# Patient Record
Sex: Female | Born: 1945 | Race: White | Hispanic: No | Marital: Married | Smoking: Former smoker
Health system: Southern US, Community
[De-identification: ages and names within clinical notes are randomized; demographics above are authoritative.]

## PROBLEM LIST (undated history)

## (undated) DIAGNOSIS — M21619 Bunion of unspecified foot: Secondary | ICD-10-CM

## (undated) DIAGNOSIS — Z8489 Family history of other specified conditions: Secondary | ICD-10-CM

## (undated) DIAGNOSIS — G500 Trigeminal neuralgia: Secondary | ICD-10-CM

## (undated) DIAGNOSIS — T8859XA Other complications of anesthesia, initial encounter: Secondary | ICD-10-CM

## (undated) DIAGNOSIS — J189 Pneumonia, unspecified organism: Secondary | ICD-10-CM

## (undated) HISTORY — PX: BUNIONECTOMY: SHX129

## (undated) HISTORY — PX: BRAIN SURGERY: SHX531

## (undated) HISTORY — PX: CEREBRAL MICROVASCULAR DECOMPRESSION: SHX1328

## (undated) HISTORY — PX: OTHER SURGICAL HISTORY: SHX169

## (undated) HISTORY — DX: Trigeminal neuralgia: G50.0

---

## 2019-07-17 ENCOUNTER — Ambulatory Visit: Payer: Medicare Other | Attending: Internal Medicine

## 2019-07-17 DIAGNOSIS — Z23 Encounter for immunization: Secondary | ICD-10-CM | POA: Insufficient documentation

## 2019-07-17 NOTE — Progress Notes (Signed)
   Covid-19 Vaccination Clinic  Name:  Ellen Griffith    MRN: [id number] DOB: [birth year]  07/17/2019  Ellen Griffith was observed post Covid-19 immunization for 15 minutes without incident. She was provided with Vaccine Information Sheet and instruction to access the V-Safe system.   Ellen Griffith was instructed to call 911 with any severe reactions post vaccine: Marland Kitchen Difficulty breathing  . Swelling of face and throat  . A fast heartbeat  . A bad rash all over body  . Dizziness and weakness   Immunizations Administered    Name Date Dose VIS Date Route   Pfizer COVID-19 Vaccine 07/17/2019 10:28 AM 0.3 mL 04/22/2019 Intramuscular   Manufacturer: [employer], [employer]   Lot: [id number]   NDC: [address]-3

## 2019-08-17 ENCOUNTER — Ambulatory Visit: Payer: Medicare Other | Attending: Internal Medicine

## 2019-08-17 DIAGNOSIS — Z23 Encounter for immunization: Secondary | ICD-10-CM

## 2019-08-17 NOTE — Progress Notes (Signed)
   Covid-19 Vaccination Clinic  Name:  Ellen Griffith    MRN: [id number] DOB: February 23, 1946  08/17/2019  [name] was observed post Covid-19 immunization for 15 minutes without incident. She was provided with Vaccine Information Sheet and instruction to access the V-Safe system.   [name] was instructed to call 911 with any severe reactions post vaccine: Marland Kitchen Difficulty breathing  . Swelling of face and throat  . A fast heartbeat  . A bad rash all over body  . Dizziness and weakness   Immunizations Administered    Name Date Dose VIS Date Route   Pfizer COVID-19 Vaccine 08/17/2019 11:48 AM 0.3 mL 04/22/2019 Intramuscular   Manufacturer: [employer], [employer]   Lot: [id number]   NDC: [address]-6

## 2020-03-24 ENCOUNTER — Ambulatory Visit: Payer: Medicare Other | Attending: Internal Medicine

## 2020-03-24 DIAGNOSIS — Z23 Encounter for immunization: Secondary | ICD-10-CM

## 2020-03-24 NOTE — Progress Notes (Signed)
   Covid-19 Vaccination Clinic  Name:  Ellen Griffith    MRN: [id number] DOB: 12-06-1945  03/24/2020  [name] was observed post Covid-19 immunization for 15 minutes without incident. She was provided with Vaccine Information Sheet and instruction to access the V-Safe system.   [name] was instructed to call 911 with any severe reactions post vaccine: Marland Kitchen Difficulty breathing  . Swelling of face and throat  . A fast heartbeat  . A bad rash all over body  . Dizziness and weakness   Immunizations Administered    Name Date Dose VIS Date Route   Pfizer COVID-19 Vaccine 03/24/2020  3:32 PM 0.3 mL 02/29/2020 Intramuscular   Manufacturer: [employer], [employer]   Lot: [id number]   NDC: [address]-5

## 2020-05-08 ENCOUNTER — Other Ambulatory Visit: Payer: Self-pay | Admitting: Registered Nurse

## 2020-05-08 DIAGNOSIS — E2839 Other primary ovarian failure: Secondary | ICD-10-CM

## 2020-05-08 DIAGNOSIS — Z1231 Encounter for screening mammogram for malignant neoplasm of breast: Secondary | ICD-10-CM

## 2020-05-21 ENCOUNTER — Other Ambulatory Visit: Payer: Self-pay

## 2020-05-21 ENCOUNTER — Encounter: Payer: Self-pay | Admitting: Plastic Surgery

## 2020-05-21 ENCOUNTER — Ambulatory Visit (INDEPENDENT_AMBULATORY_CARE_PROVIDER_SITE_OTHER): Payer: Medicare Other | Admitting: Plastic Surgery

## 2020-05-21 VITALS — BP 120/68 | HR 70 | Ht 65.0 in | Wt 140.4 lb

## 2020-05-21 DIAGNOSIS — T84498A Other mechanical complication of other internal orthopedic devices, implants and grafts, initial encounter: Secondary | ICD-10-CM

## 2020-05-21 NOTE — Progress Notes (Signed)
   Referring Provider Karl Ito, DO [address] [place],  [place] [address]   CC:  Chief Complaint  Patient presents with  . Advice Only      Ellen Griffith is an 75 y.o. female.  HPI: Patient presents with exposed hardware in the right posterior scalp.  She had a decompression for trigeminal neuralgia that required at titanium mesh plate be placed in the mastoid area.  Several months ago she noticed it looking different back there and she developed an exposure.  She has seen Dr. Johnsie Cancel with neurosurgery who is planning removal of the plate and he is asked for my assistance in closing the wound.  Patient reports a little bit of pain and itching back there but nothing bad.  No Known Allergies  Outpatient Encounter Medications as of 05/21/2020  Medication Sig  . LIVALO 4 MG TABS Take 1 tablet by mouth daily.  Marland Kitchen topiramate (TOPAMAX) 200 MG tablet Take 1 tablet by mouth 2 (two) times daily.  . traMADol (ULTRAM) 50 MG tablet Take 50 mg by mouth 2 (two) times daily as needed.  Marland Kitchen VASCEPA 1 g capsule Take 2 g by mouth 2 (two) times daily.  Marland Kitchen venlafaxine XR (EFFEXOR-XR) 75 MG 24 hr capsule Take 75 mg by mouth daily.  . Vitamin D, Ergocalciferol, (DRISDOL) 1.25 MG (50000 UNIT) CAPS capsule Take 50,000 Units by mouth once a week.   No facility-administered encounter medications on file as of 05/21/2020.     No past medical history on file.   No family history on file.  Social History   Social History Narrative  . Not on file  Denies tobacco use  Review of Systems General: Denies fevers, chills, weight loss CV: Denies chest pain, shortness of breath, palpitations  Physical Exam Vitals with BMI 05/21/2020  Height 5\' 5"   Weight 140 lbs 6 oz  BMI 23.36  Systolic 120  Diastolic 68  Pulse 70    General:  No acute distress,  Alert and oriented, Non-Toxic, Normal speech and affect Examination shows around a 4 cm x 3 cm area of exposed plate within the hairbearing scalp posterior to  the right ear.  There is a little bit of surrounding erythema but no purulence or drainage.  It looks like her scar is extending superiorly from that location.  I do not see any other obvious scars in the posterior aspect of her scalp.  Assessment/Plan Patient presents with exposed hardware in the hairbearing scalp.  We discussed the joint case with Dr. where he would remove the plate and do any bony debridement that was required followed by me doing a rotational flap to close it.  I took a picture and drew out the planned incision so that she would have an understanding of the plan.  We discussed the risks that include bleeding, infection, damage to surrounding structures and need for additional procedures.  All of her questions were answered and we will plan to coordinate this case as soon as possible for her.  Johnsie Cancel 05/21/2020, 2:08 PM

## 2020-05-31 ENCOUNTER — Other Ambulatory Visit: Payer: Self-pay | Admitting: Neurological Surgery

## 2020-06-12 ENCOUNTER — Other Ambulatory Visit: Payer: Self-pay

## 2020-06-12 ENCOUNTER — Encounter: Payer: Self-pay | Admitting: Neurology

## 2020-06-12 ENCOUNTER — Ambulatory Visit (INDEPENDENT_AMBULATORY_CARE_PROVIDER_SITE_OTHER): Payer: Medicare Other | Admitting: Neurology

## 2020-06-12 VITALS — BP 104/65 | HR 75 | Ht 65.5 in | Wt 146.0 lb

## 2020-06-12 DIAGNOSIS — G500 Trigeminal neuralgia: Secondary | ICD-10-CM

## 2020-06-12 NOTE — Patient Instructions (Signed)
Consider other medications for TGN: including Trileptal(Oxcarbazepine which is similar tegretol but less side effects), Lamictal(Lamotrigine), Baclofen. botox results have not been convincing. Also look into Gamma Knife for TGN   Trigeminal Neuralgia  Trigeminal neuralgia is a nerve disorder that causes severe pain on one side of the face. The pain may last from a few seconds to several minutes. The pain is usually only on one side of the face. Symptoms may occur for days, weeks, or months and then go away for months or years. The pain may return and be worse than before. What are the causes? This condition is caused by damage or pressure to a nerve in the head that is called the trigeminal nerve. An attack can be triggered by:  Talking.  Chewing.  Putting on makeup.  Washing your face.  Shaving your face.  Brushing your teeth.  Touching your face. What increases the risk? You are more likely to develop this condition if you:  Are 58 years of age or older.  Are female. What are the signs or symptoms? The main symptom of this condition is severe pain in the:  Jaw.  Lips.  Eyes.  Nose.  Scalp.  Forehead.  Face. The pain may be:  Intense.  Stabbing.  Electric.  Shock-like. How is this diagnosed? This condition is diagnosed with a physical exam. A CT scan or an MRI may be done to rule out other conditions that can cause facial pain. How is this treated? This condition may be treated with:  Avoiding the things that trigger your symptoms.  Taking prescription medicines (anticonvulsants).  Having surgery. This may be done in severe cases if other medical treatment does not provide relief.  Having procedures such as ablation, thermal, or radiation therapy. It may take up to one month for treatment to start relieving the pain. Follow these instructions at home: Managing pain  Learn as much as you can about how to manage your pain. Ask your health care  provider if a pain specialist would be helpful.  Consider talking with a mental health care provider (psychologist) about how to cope with the pain.  Consider joining a pain support group. General instructions  Take over-the-counter and prescription medicines only as told by your health care provider.  Avoid the things that trigger your symptoms. It may help to: ? Chew on the unaffected side of your mouth. ? Avoid touching your face. ? Avoid blasts of hot or cold air.  Follow your treatment plan as told by your health care provider. This may include: ? Cognitive or behavioral therapy. ? Gentle, regular exercise. ? Meditation or yoga. ? Aromatherapy.  Keep all follow-up visits as told by your health care provider. You may need to be monitored closely to make sure treatment is working well for you. Where to find more information  Facial Pain Association: fpa-support.org Contact a health care provider if:  Your medicine is not helping your symptoms.  You have side effects from the medicine used for treatment.  You develop new, unexplained symptoms, such as: ? Double vision. ? Facial weakness. ? Facial numbness. ? Changes in hearing or balance.  You feel depressed. Get help right away if:  Your pain is severe and is not getting better.  You develop suicidal thoughts. If you ever feel like you may hurt yourself or others, or have thoughts about taking your own life, get help right away. You can go to your nearest emergency department or call:  Your local emergency  services (911 in the [place]  A suicide crisis helpline, such as the National Suicide Prevention Lifeline at 406-852-4031. This is open 24 hours a day. Summary  Trigeminal neuralgia is a nerve disorder that causes severe pain on one side of the face. The pain may last from a few seconds to several minutes.  This condition is caused by damage or pressure to a nerve in the head that is called the trigeminal  nerve.  Treatment may include avoiding the things that trigger your symptoms, taking medicines, or having surgery or procedures. It may take up to one month for treatment to start relieving the pain.  Avoid the things that trigger your symptoms.  Keep all follow-up visits as told by your health care provider. You may need to be monitored closely to make sure treatment is working well for you. This information is not intended to replace advice given to you by your health care provider. Make sure you discuss any questions you have with your health care provider. Document Revised: 03/15/2018 Document Reviewed: 03/15/2018 Elsevier Patient Education  2021 [employer].

## 2020-06-12 NOTE — Progress Notes (Signed)
[facility]    Provider:  Dr Lucia Gaskins Requesting Provider: Jadene Pierini, MD Primary Care Provider:  Loura Back, NP  CC:  Trigeminal neuralgia  HPI:  Ellen Griffith is a 75 y.o. female here as requested by Jadene Pierini, MD for Ellen Griffith for facial pain/trigeminal neuralgia.   I reviewed Dr. Romana Juniper notes, she is a 75 year old female who has trigeminal neuralgia status post microvascular decompression at [facility] roughly 10 years prior, roughly 1 year ago she had wound breakdown and a plastic surgeon did some local wound procedure, no systemic symptoms, no discharge from the wound, no history of infections coming from the wound, Dr. Johnsie Cancel thought she had what sounded like type I trigeminal neuralgia prior to surgery, resolution of symptoms postop, the development of what sounds like type II trigeminal neuralgia, she is not a smoker, I reviewed Dr. Romana Juniper examination which was normal including palpation of the spine, range of motion, motor strength, sensory, absent Hoffmann's, absent clonus, diagnosed with degenerative status post microvascular decompression with delayed wound breakdown and exposed hardware, 1 treatment option was surgical revision of the wound, he thought it would be unwise and likely low yield to perform a repeat exploration and microvascular decompression at the same time as her wound revision, they would have to remove the plate and make sure she does not have any ongoing osteomyelitis and perform a complex repair with plastic surgery, she was referred to Dr. Karle Barr for evaluation regarding this, here for treatment of the facial pain.  She had microvascular decompression at [facility] and over the last few years the wound  Stitches have come out and she is going to have a revision there. She has TGN for years and it was so bad she went and had surgery in 2008 and it helped but was not complete relief. Sheis on Topamax. She has  never been off of that. She takes tramadol when it is bad. The pain varies down the jawline but can happen in any distribution, used to be shock like but since the operation different, heating pad helps, brushing or eating triggers, brief but can have some lasting lingering pain in between, random, wwaxes and wanes can happen every day for a week and then none for a week. Unknown inciting events. She get going to the dentist thought it was dental pain, No rashs. No other focal neurologic deficits, associated symptoms, inciting events or modifiable factors.  Reviewed notes, labs and imaging from outside physicians, which showed: see above  Review of Systems: Patient complains of symptoms per HPI as well as the following symptoms: facial pain. Pertinent negatives and positives per HPI. All others negative.   Social History   Socioeconomic History  . Marital status: Married    Spouse name: Not on file  . Number of children: 2  . Years of education: Not on file  . Highest education level: Master's degree (e.g., MA, MS, MEng, MEd, MSW, MBA)  Occupational History  . Not on file  Tobacco Use  . Smoking status: Former Smoker    Types: Cigarettes    Quit date: 05/21/2018    Years since quitting: 2.0  . Smokeless tobacco: Never Used  Vaping Use  . Vaping Use: Never used  Substance and Sexual Activity  . Alcohol use: Yes    Comment: "wine once in awhile"  . Drug use: Never  . Sexual activity: Not on file  Other Topics Concern  . Not on file  Social History Narrative  Pt and husband are currently living with daughter. Was previously in [place], currently looking for a place.   Right handed   Caffeine: maybe 3 cups/day   Social Determinants of Health   Financial Resource Strain: Not on file  Food Insecurity: Not on file  Transportation Needs: Not on file  Physical Activity: Not on file  Stress: Not on file  Social Connections: Not on file  Intimate Partner Violence: Not on file     Family History  Problem Relation Age of Onset  . Heart disease Mother        valve problems secondary to rheumatic heart disease  . Migraines Daughter     Past Medical History:  Diagnosis Date  . Trigeminal neuralgia     Patient Active Problem List   Diagnosis Date Noted  . Trigeminal neuralgia of right side of face 06/12/2020    Past Surgical History:  Procedure Laterality Date  . BUNIONECTOMY Bilateral   . CEREBRAL MICROVASCULAR DECOMPRESSION  2008-2009   trigeminal neuralgia; at [facility]  . wound procedure     by plastic surgery    Current Outpatient Medications  Medication Sig Dispense Refill  . LIVALO 4 MG TABS Take 1 tablet by mouth daily.    Marland Kitchen topiramate (TOPAMAX) 200 MG tablet Take 1 tablet by mouth 2 (two) times daily.    . traMADol (ULTRAM) 50 MG tablet Take 50 mg by mouth 2 (two) times daily as needed.    Marland Kitchen VASCEPA 1 g capsule Take 2 g by mouth 2 (two) times daily.    Marland Kitchen venlafaxine XR (EFFEXOR-XR) 75 MG 24 hr capsule Take 75 mg by mouth daily.    . Vitamin D, Ergocalciferol, (DRISDOL) 1.25 MG (50000 UNIT) CAPS capsule Take 50,000 Units by mouth once a week.     No current facility-administered medications for this visit.    Allergies as of 06/12/2020  . (No Known Allergies)    Vitals: BP 104/65 (BP Location: Right Arm, Patient Position: Sitting)   Pulse 75   Ht 5' 5.5" (1.664 m)   Wt 146 lb (66.2 kg)   BMI 23.93 kg/m  Last Weight:  Wt Readings from Last 1 Encounters:  06/12/20 146 lb (66.2 kg)   Last Height:   Ht Readings from Last 1 Encounters:  06/12/20 5' 5.5" (1.664 m)     Physical exam: Exam: Gen: NAD, conversant, well nourised, well groomed                     CV: RRR, no MRG. No Carotid Bruits. No peripheral edema, warm, nontender Eyes: Conjunctivae clear without exudates or hemorrhage  Neuro: Detailed Neurologic Exam  Speech:    Speech is normal; fluent and spontaneous with normal comprehension.  Cognition:    The  patient is oriented to person, place, and time;     recent and remote memory intact;     language fluent;     normal attention, concentration,     fund of knowledge Cranial Nerves:    The pupils are equal, round, and reactive to light. Pupils too small to visualize fundi. Visual fields are full to threat. Extraocular movements are intact. Trigeminal sensation is intact and the muscles of mastication are normal. The face is symmetric. The palate elevates in the midline. Hearing intact. Voice is normal. Shoulder shrug is normal. The tongue has normal motion without fasciculations.   Coordination:    No dysmetria or ataxia  Gait:    Normal native  gait  Motor Observation:    No asymmetry, no atrophy, and no involuntary movements noted. Tone:    Normal muscle tone.    Posture:    Posture is normal. normal erect    Strength:    Strength is V/V in the upper and lower limbs.      Sensation: intact to LT     Reflex Exam:  DTR's:    Deep tendon reflexes in the upper and lower extremities are normal bilaterally.   Toes:    The toes are downgoing bilaterally.   Clonus:    Clonus is absent.    Assessment/Plan:  [age] year old with TGN right side s/p microvascular decompression and on Topamax. We discussed options, she will return in 4-6 week.  Consider other medications for TGN: including Trileptal(Oxcarbazepine which is similar tegretol but less side effects), Lamictal(Lamotrigine), Baclofen. botox results have not been convincing. Also look into Gamma Knife for TGN, showed her some videos online she can watch   No orders of the defined types were placed in this encounter.  No orders of the defined types were placed in this encounter.   Cc: Jadene Pierini, MD,  Loura Back, NP, Dr. Maurice Small [facility])  Naomie Dean, MD  [facility] [address] [place], [place] [address]  Phone [phone] Fax [phone]  I spent over  60 minutes of face-to-face and non-face-to-face time with patient on the  1. Trigeminal neuralgia of right side of face    diagnosis.  This included previsit chart review, lab review, study review, order entry, electronic health record documentation, patient education on the different diagnostic and therapeutic options, counseling and coordination of care, risks and benefits of management, compliance, or risk factor reduction

## 2020-06-14 ENCOUNTER — Other Ambulatory Visit: Payer: Self-pay

## 2020-06-14 ENCOUNTER — Ambulatory Visit (INDEPENDENT_AMBULATORY_CARE_PROVIDER_SITE_OTHER): Payer: Medicare Other | Admitting: Surgical

## 2020-06-14 ENCOUNTER — Encounter: Payer: Self-pay | Admitting: Surgical

## 2020-06-14 VITALS — BP 122/72 | HR 78 | Ht 65.0 in | Wt 145.0 lb

## 2020-06-14 DIAGNOSIS — T84498A Other mechanical complication of other internal orthopedic devices, implants and grafts, initial encounter: Secondary | ICD-10-CM

## 2020-06-14 MED ORDER — ONDANSETRON HCL 4 MG PO TABS: 4.0000 mg | ORAL_TABLET | Freq: Three times a day (TID) | ORAL | 0 refills | Status: DC | PRN

## 2020-06-14 MED ORDER — HYDROCODONE-ACETAMINOPHEN 5-325 MG PO TABS: 1.0000 | ORAL_TABLET | Freq: Four times a day (QID) | ORAL | 0 refills | Status: AC | PRN

## 2020-06-14 NOTE — Progress Notes (Signed)
Patient ID: Ellen Griffith, female    DOB: January 25, 1946, 75 y.o.   MRN: [id number]  Chief Complaint  Patient presents with  . Pre-op Exam      ICD-10-CM   1. Exposed orthopaedic hardware [facility])  T84.498A     History of Present Illness: Ellen Griffith is a 75 y.o.  female  with a history of scalp hardware that has now become exposed in the right posterior scalp.  She presents for preoperative evaluation for upcoming procedure, complex scalp wound closure with adjacent tissue transfer, possible split-thickness skin graft  after retrosigmoid craniectomy for removal of plate by neurosurgery, scheduled for 06/26/2020 with Dr. Arita Miss and Dr. Maurice Small  The patient has not had problems with anesthesia. No history of DVT/PE.  No family history of DVT/PE.  No family or personal history of bleeding or clotting disorders.  Patient is not currently taking any blood thinners.  No history of CVA/MI.   Summary of Previous Visit: Patient had decompression for trigeminal neuralgia that required titanium mesh plate be placed in the mastoid area.  She noticed it looking different bacteria and she developed an exposure.  She has seen neurosurgery who is planning to remove the plate.  Neurosurgery has asked for assistance with closing the wound.  PMH Significant for: Hyperlipidemia, trigeminal neuralgia,   Past Medical History: Allergies: No Known Allergies  Current Medications:  Current Outpatient Medications:  .  HYDROcodone-acetaminophen (NORCO) 5-325 MG tablet, Take 1 tablet by mouth every 6 (six) hours as needed for up to 5 days for severe pain (Postop pain only)., Disp: 20 tablet, Rfl: 0 .  LIVALO 4 MG TABS, Take 1 tablet by mouth daily., Disp: , Rfl:  .  ondansetron (ZOFRAN) 4 MG tablet, Take 1 tablet (4 mg total) by mouth every 8 (eight) hours as needed for nausea or vomiting., Disp: 20 tablet, Rfl: 0 .  topiramate (TOPAMAX) 200 MG tablet, Take 1 tablet by mouth 2 (two) times daily., Disp: , Rfl:  .   traMADol (ULTRAM) 50 MG tablet, Take 50 mg by mouth 2 (two) times daily as needed., Disp: , Rfl:  .  VASCEPA 1 g capsule, Take 2 g by mouth 2 (two) times daily., Disp: , Rfl:  .  venlafaxine XR (EFFEXOR-XR) 75 MG 24 hr capsule, Take 75 mg by mouth daily., Disp: , Rfl:  .  Vitamin D, Ergocalciferol, (DRISDOL) 1.25 MG (50000 UNIT) CAPS capsule, Take 50,000 Units by mouth once a week., Disp: , Rfl:   Past Medical Problems: Past Medical History:  Diagnosis Date  . Trigeminal neuralgia     Past Surgical History: Past Surgical History:  Procedure Laterality Date  . BUNIONECTOMY Bilateral   . CEREBRAL MICROVASCULAR DECOMPRESSION  2008-2009   trigeminal neuralgia; at [facility]  . wound procedure     by plastic surgery    Social History: Social History   Socioeconomic History  . Marital status: Married    Spouse name: Not on file  . Number of children: 2  . Years of education: Not on file  . Highest education level: Master's degree (e.g., MA, MS, MEng, MEd, MSW, MBA)  Occupational History  . Not on file  Tobacco Use  . Smoking status: Former Smoker    Types: Cigarettes    Quit date: 05/21/2018    Years since quitting: 2.0  . Smokeless tobacco: Never Used  Vaping Use  . Vaping Use: Never used  Substance and Sexual Activity  . Alcohol use: Yes  Comment: "wine once in awhile"  . Drug use: Never  . Sexual activity: Not on file  Other Topics Concern  . Not on file  Social History Narrative   Pt and husband are currently living with daughter. Was previously in [place], currently looking for a place.   Right handed   Caffeine: maybe 3 cups/day   Social Determinants of Health   Financial Resource Strain: Not on file  Food Insecurity: Not on file  Transportation Needs: Not on file  Physical Activity: Not on file  Stress: Not on file  Social Connections: Not on file  Intimate Partner Violence: Not on file    Family History: Family History  Problem Relation Age of Onset   . Heart disease Mother        valve problems secondary to rheumatic heart disease  . Migraines Daughter     Review of Systems: Review of Systems  Constitutional: Negative.   Respiratory: Negative.   Cardiovascular: Negative.   Gastrointestinal: Negative.   Genitourinary: Negative.     Physical Exam: Vital Signs BP 122/72 (BP Location: Left Arm, Patient Position: Sitting, Cuff Size: Normal)   Pulse 78   Ht 5\' 5"  (1.651 m)   Wt 145 lb (65.8 kg)   SpO2 100%   BMI 24.13 kg/m  Physical Exam Constitutional:      General: She is not in acute distress.    Appearance: Normal appearance. She is not ill-appearing.  HENT:     Head: Normocephalic and atraumatic.  Exposed plate within the postauricular area on the right side. Eyes:     Pupils: Pupils are equal, round Neck:     Musculoskeletal: Normal range of motion.  Cardiovascular:     Rate and Rhythm: Normal rate and regular rhythm.     Pulses: Normal pulses.     Heart sounds: Normal heart sounds. No murmur.  Pulmonary:     Effort: Pulmonary effort is normal. No respiratory distress.     Breath sounds: Normal breath sounds. No wheezing.  Abdominal:     General: Abdomen is flat. There is no distension.     Palpations: Abdomen is soft.     Tenderness: There is no abdominal tenderness.  Musculoskeletal: Normal range of motion.  Skin:    General: Skin is warm and dry.     Findings: No erythema or rash.  Neurological:     General: No focal deficit present.     Mental Status: She is alert and oriented to person, place, and time. Mental status is at baseline.     Motor: No weakness.  Psychiatric:        Mood and Affect: Mood normal.        Behavior: Behavior normal.     Assessment/Plan: The patient is scheduled for complex scalp wound closure with possible adjacent tissue transfer and possible split-thickness skin graft with Dr. after retrosigmoid craniectomy for removal of plate by neurosurgery.  Risks, benefits, and  alternatives of procedure discussed, questions answered and consent obtained.    Smoking Status: Non-smoker; Counseling Given?  N/A Last Mammogram: NA; Results: NA  Caprini Score: 4, moderate; Risk Factors include: Age, and length of planned surgery. Recommendation for mechanical and pharmacological prophylaxis. Encourage early ambulation.   Pictures obtained: In office today Pictures were obtained of the patient and placed in the chart with the patient's or guardian's permission.  Post-op Rx sent to pharmacy: Norco, Zofran  CP clearance to patient's PCP, we will also request patient to hold  Vascepa/icosapent preoperatively per recommendations of PCP.  Can slightly increase bleeding risk  Patient was provided with the General Surgical Risk consent document and Pain Medication Agreement prior to their appointment.  They had adequate time to read through the risk consent documents and Pain Medication Agreement. We also discussed them in person together during this preop appointment. All of their questions were answered to their satisfaction.  Recommended calling if they have any further questions.  Risk consent form and Pain Medication Agreement to be scanned into patient's chart.  We discussed the risks associated with adjacent tissue transfer, split-thickness skin graft procedure.   The risks that can be encountered with and after a skin graft were discussed and include the following but not limited to these: bleeding, infection, delayed healing, anesthesia risks, skin sensation changes, injury to structures including nerves, blood vessels, and muscles which may be temporary or permanent, allergies to tape, suture materials and glues, blood products, topical preparations or injected agents, skin contour irregularities, skin discoloration and swelling, deep vein thrombosis, cardiac and pulmonary complications, pain, which may persist, failure of the graft and possible need for revisional surgery or  staged procedures.   Electronically signed by: Kermit Balo Rochele Lueck, PA-C 06/14/2020 2:52 PM

## 2020-06-14 NOTE — H&P (View-Only) (Signed)
Patient ID: Ellen Griffith, female    DOB: January 25, 1946, 75 y.o.   MRN: [id number]  Chief Complaint  Patient presents with  . Pre-op Exam      ICD-10-CM   1. Exposed orthopaedic hardware [facility])  T84.498A     History of Present Illness: Ellen Griffith is a 75 y.o.  female  with a history of scalp hardware that has now become exposed in the right posterior scalp.  She presents for preoperative evaluation for upcoming procedure, complex scalp wound closure with adjacent tissue transfer, possible split-thickness skin graft  after retrosigmoid craniectomy for removal of plate by neurosurgery, scheduled for 06/26/2020 with Dr. Arita Miss and Dr. Maurice Small  The patient has not had problems with anesthesia. No history of DVT/PE.  No family history of DVT/PE.  No family or personal history of bleeding or clotting disorders.  Patient is not currently taking any blood thinners.  No history of CVA/MI.   Summary of Previous Visit: Patient had decompression for trigeminal neuralgia that required titanium mesh plate be placed in the mastoid area.  She noticed it looking different bacteria and she developed an exposure.  She has seen neurosurgery who is planning to remove the plate.  Neurosurgery has asked for assistance with closing the wound.  PMH Significant for: Hyperlipidemia, trigeminal neuralgia,   Past Medical History: Allergies: No Known Allergies  Current Medications:  Current Outpatient Medications:  .  HYDROcodone-acetaminophen (NORCO) 5-325 MG tablet, Take 1 tablet by mouth every 6 (six) hours as needed for up to 5 days for severe pain (Postop pain only)., Disp: 20 tablet, Rfl: 0 .  LIVALO 4 MG TABS, Take 1 tablet by mouth daily., Disp: , Rfl:  .  ondansetron (ZOFRAN) 4 MG tablet, Take 1 tablet (4 mg total) by mouth every 8 (eight) hours as needed for nausea or vomiting., Disp: 20 tablet, Rfl: 0 .  topiramate (TOPAMAX) 200 MG tablet, Take 1 tablet by mouth 2 (two) times daily., Disp: , Rfl:  .   traMADol (ULTRAM) 50 MG tablet, Take 50 mg by mouth 2 (two) times daily as needed., Disp: , Rfl:  .  VASCEPA 1 g capsule, Take 2 g by mouth 2 (two) times daily., Disp: , Rfl:  .  venlafaxine XR (EFFEXOR-XR) 75 MG 24 hr capsule, Take 75 mg by mouth daily., Disp: , Rfl:  .  Vitamin D, Ergocalciferol, (DRISDOL) 1.25 MG (50000 UNIT) CAPS capsule, Take 50,000 Units by mouth once a week., Disp: , Rfl:   Past Medical Problems: Past Medical History:  Diagnosis Date  . Trigeminal neuralgia     Past Surgical History: Past Surgical History:  Procedure Laterality Date  . BUNIONECTOMY Bilateral   . CEREBRAL MICROVASCULAR DECOMPRESSION  2008-2009   trigeminal neuralgia; at [facility]  . wound procedure     by plastic surgery    Social History: Social History   Socioeconomic History  . Marital status: Married    Spouse name: Not on file  . Number of children: 2  . Years of education: Not on file  . Highest education level: Master's degree (e.g., MA, MS, MEng, MEd, MSW, MBA)  Occupational History  . Not on file  Tobacco Use  . Smoking status: Former Smoker    Types: Cigarettes    Quit date: 05/21/2018    Years since quitting: 2.0  . Smokeless tobacco: Never Used  Vaping Use  . Vaping Use: Never used  Substance and Sexual Activity  . Alcohol use: Yes  Comment: "wine once in awhile"  . Drug use: Never  . Sexual activity: Not on file  Other Topics Concern  . Not on file  Social History Narrative   Pt and husband are currently living with daughter. Was previously in [place], currently looking for a place.   Right handed   Caffeine: maybe 3 cups/day   Social Determinants of Health   Financial Resource Strain: Not on file  Food Insecurity: Not on file  Transportation Needs: Not on file  Physical Activity: Not on file  Stress: Not on file  Social Connections: Not on file  Intimate Partner Violence: Not on file    Family History: Family History  Problem Relation Age of Onset   . Heart disease Mother        valve problems secondary to rheumatic heart disease  . Migraines Daughter     Review of Systems: Review of Systems  Constitutional: Negative.   Respiratory: Negative.   Cardiovascular: Negative.   Gastrointestinal: Negative.   Genitourinary: Negative.     Physical Exam: Vital Signs BP 122/72 (BP Location: Left Arm, Patient Position: Sitting, Cuff Size: Normal)   Pulse 78   Ht 5' 5" (1.651 m)   Wt 145 lb (65.8 kg)   SpO2 100%   BMI 24.13 kg/m  Physical Exam Constitutional:      General: She is not in acute distress.    Appearance: Normal appearance. She is not ill-appearing.  HENT:     Head: Normocephalic and atraumatic.  Exposed plate within the postauricular area on the right side. Eyes:     Pupils: Pupils are equal, round Neck:     Musculoskeletal: Normal range of motion.  Cardiovascular:     Rate and Rhythm: Normal rate and regular rhythm.     Pulses: Normal pulses.     Heart sounds: Normal heart sounds. No murmur.  Pulmonary:     Effort: Pulmonary effort is normal. No respiratory distress.     Breath sounds: Normal breath sounds. No wheezing.  Abdominal:     General: Abdomen is flat. There is no distension.     Palpations: Abdomen is soft.     Tenderness: There is no abdominal tenderness.  Musculoskeletal: Normal range of motion.  Skin:    General: Skin is warm and dry.     Findings: No erythema or rash.  Neurological:     General: No focal deficit present.     Mental Status: She is alert and oriented to person, place, and time. Mental status is at baseline.     Motor: No weakness.  Psychiatric:        Mood and Affect: Mood normal.        Behavior: Behavior normal.     Assessment/Plan: The patient is scheduled for complex scalp wound closure with possible adjacent tissue transfer and possible split-thickness skin graft with Dr. Pace after retrosigmoid craniectomy for removal of plate by neurosurgery.  Risks, benefits, and  alternatives of procedure discussed, questions answered and consent obtained.    Smoking Status: Non-smoker; Counseling Given?  N/A Last Mammogram: NA; Results: NA  Caprini Score: 4, moderate; Risk Factors include: Age, and length of planned surgery. Recommendation for mechanical and pharmacological prophylaxis. Encourage early ambulation.   Pictures obtained: In office today Pictures were obtained of the patient and placed in the chart with the patient's or guardian's permission.  Post-op Rx sent to pharmacy: Norco, Zofran  CP clearance to patient's PCP, we will also request patient to hold   Vascepa/icosapent preoperatively per recommendations of PCP.  Can slightly increase bleeding risk  Patient was provided with the General Surgical Risk consent document and Pain Medication Agreement prior to their appointment.  They had adequate time to read through the risk consent documents and Pain Medication Agreement. We also discussed them in person together during this preop appointment. All of their questions were answered to their satisfaction.  Recommended calling if they have any further questions.  Risk consent form and Pain Medication Agreement to be scanned into patient's chart.  We discussed the risks associated with adjacent tissue transfer, split-thickness skin graft procedure.   The risks that can be encountered with and after a skin graft were discussed and include the following but not limited to these: bleeding, infection, delayed healing, anesthesia risks, skin sensation changes, injury to structures including nerves, blood vessels, and muscles which may be temporary or permanent, allergies to tape, suture materials and glues, blood products, topical preparations or injected agents, skin contour irregularities, skin discoloration and swelling, deep vein thrombosis, cardiac and pulmonary complications, pain, which may persist, failure of the graft and possible need for revisional surgery or  staged procedures.   Electronically signed by: Kermit Balo Hong Timm, PA-C 06/14/2020 2:52 PM

## 2020-06-20 ENCOUNTER — Encounter (HOSPITAL_COMMUNITY)
Admission: RE | Admit: 2020-06-20 | Discharge: 2020-06-20 | Disposition: A | Discharge status: Home / Self Care | Payer: Medicare Other | Source: Ambulatory Visit | Attending: Neurological Surgery | Admitting: Neurological Surgery

## 2020-06-20 ENCOUNTER — Encounter (HOSPITAL_COMMUNITY): Payer: Self-pay

## 2020-06-20 ENCOUNTER — Other Ambulatory Visit: Payer: Self-pay

## 2020-06-20 DIAGNOSIS — Z01812 Encounter for preprocedural laboratory examination: Secondary | ICD-10-CM | POA: Insufficient documentation

## 2020-06-20 HISTORY — DX: Other complications of anesthesia, initial encounter: T88.59XA

## 2020-06-20 HISTORY — DX: Family history of other specified conditions: Z84.89

## 2020-06-20 HISTORY — DX: Pneumonia, unspecified organism: J18.9

## 2020-06-20 HISTORY — DX: Bunion of unspecified foot: M21.619

## 2020-06-20 LAB — TYPE AND SCREEN
ABO/RH(D): A POS
Antibody Screen: NEGATIVE

## 2020-06-20 LAB — CBC
HCT: 41.1 % (ref 36.0–46.0)
Hemoglobin: 13 g/dL (ref 12.0–15.0)
MCH: 31.3 pg (ref 26.0–34.0)
MCHC: 31.6 g/dL (ref 30.0–36.0)
MCV: 98.8 fL (ref 80.0–100.0)
Platelets: 222 10*3/uL (ref 150–400)
RBC: 4.16 MIL/uL (ref 3.87–5.11)
RDW: 13.5 % (ref 11.5–15.5)
WBC: 5.1 10*3/uL (ref 4.0–10.5)
nRBC: 0 % (ref 0.0–0.2)

## 2020-06-20 LAB — BASIC METABOLIC PANEL
Anion gap: 8 (ref 5–15)
BUN: 20 mg/dL (ref 8–23)
CO2: 21 mmol/L — ABNORMAL LOW (ref 22–32)
Calcium: 8.9 mg/dL (ref 8.9–10.3)
Chloride: 112 mmol/L — ABNORMAL HIGH (ref 98–111)
Creatinine, Ser: 1.08 mg/dL — ABNORMAL HIGH (ref 0.44–1.00)
GFR, Estimated: 54 mL/min — ABNORMAL LOW (ref >60)
Glucose, Bld: 96 mg/dL (ref 70–99)
Potassium: 4.1 mmol/L (ref 3.5–5.1)
Sodium: 141 mmol/L (ref 135–145)

## 2020-06-20 NOTE — Progress Notes (Signed)
PCP - [address] [place] NP [phone] Cardiologist - Denies  Chest x-ray - Not indicated EKG - No indicated Stress Test - Denies ECHO - Denies Cardiac Cath - Denies  Sleep Study - No OSA  DM - Denies  COVID TEST- 06/23/20   Anesthesia review: No  Patient denies shortness of breath, fever, cough and chest pain at PAT appointment   All instructions explained to the patient, with a verbal understanding of the material. Patient agrees to go over the instructions while at home for a better understanding. Patient also instructed to self quarantine after being tested for COVID-19. The opportunity to ask questions was provided.

## 2020-06-20 NOTE — Progress Notes (Signed)
Left Jessica with Dr. Marcy Siren office a message in the 9 o'clock hour and again in the afternoon regarding a question the patient had about the consent.  Just wanted to get clarification for the patient. Awaiting call back.

## 2020-06-20 NOTE — Progress Notes (Addendum)
Surgical Instructions    Your procedure is scheduled on Tuesday February 15th.  Report to [facility] Main Entrance "A" at 10 A.M., then check in with the Admitting office.  Call this number if you have problems the morning of surgery:  [phone]   If you have any questions prior to your surgery date call [phone]: Open Monday-Friday 8am-4pm    Remember:  Do not eat or drink anything after midnight the night before your surgery   Take these medicines the morning of surgery with A SIP OF WATER   LIVALO 4 MG TABS  topiramate (TOPAMAX) 200 MG tablet  venlafaxine XR (EFFEXOR-XR) 75 MG 24 hr capsule  IF NEEDED  ondansetron (ZOFRAN) 4 MG tablet  traMADol (ULTRAM) 50 MG tablet  As of today, STOP taking any Aspirin (unless otherwise instructed by your surgeon) Aleve, Naproxen, Ibuprofen, Motrin, Advil, Goody's, BC's, all herbal medications, fish oil, and all vitamins.                     Do not wear jewelry, make up, or nail polish            Do not wear lotions, powders, perfumes, or deodorant.            Do not shave 48 hours prior to surgery.             Do not bring valuables to the hospital.            [facility] is not responsible for any belongings or valuables.  Do NOT Smoke (Tobacco/Vaping) or drink Alcohol 24 hours prior to your procedure If you use a CPAP at night, you may bring all equipment for your overnight stay.   Contacts, glasses, dentures or bridgework may not be worn into surgery, please bring cases for these belongings   For patients admitted to the hospital, discharge time will be determined by your treatment team.   Patients discharged the day of surgery will not be allowed to drive home, and someone needs to stay with them for 24 hours.    Special instructions:   [facility]- Preparing For Surgery  Before surgery, you can play an important role. Because skin is not sterile, your skin needs to be as free of germs as possible. You can reduce the  number of germs on your skin by washing with CHG (chlorahexidine gluconate) Soap before surgery.  CHG is an antiseptic cleaner which kills germs and bonds with the skin to continue killing germs even after washing.    Oral Hygiene is also important to reduce your risk of infection.  Remember - BRUSH YOUR TEETH THE MORNING OF SURGERY WITH YOUR REGULAR TOOTHPASTE  Please do not use if you have an allergy to CHG or antibacterial soaps. If your skin becomes reddened/irritated stop using the CHG.  Do not shave (including legs and underarms) for at least 48 hours prior to first CHG shower. It is OK to shave your face.  Please follow these instructions carefully.   1. Shower the NIGHT BEFORE SURGERY and the MORNING OF SURGERY  2. If you chose to wash your hair, wash your hair first as usual with your normal shampoo.  3. After you shampoo, rinse your hair and body thoroughly to remove the shampoo.  4. Wash Face and genitals (private parts) with your normal soap.   5.  Shower the NIGHT BEFORE SURGERY and the MORNING OF SURGERY with CHG Soap.   6. Use CHG Soap  as you would any other liquid soap. You can apply CHG directly to the skin and wash gently with a scrungie or a clean washcloth.   7. Apply the CHG Soap to your body ONLY FROM THE NECK DOWN.  Do not use on open wounds or open sores. Avoid contact with your eyes, ears, mouth and genitals (private parts). Wash Face and genitals (private parts)  with your normal soap.   8. Wash thoroughly, paying special attention to the area where your surgery will be performed.  9. Thoroughly rinse your body with warm water from the neck down.  10. DO NOT shower/wash with your normal soap after using and rinsing off the CHG Soap.  11. Pat yourself dry with a CLEAN TOWEL.  12. Wear CLEAN PAJAMAS to bed the night before surgery  13. Place CLEAN SHEETS on your bed the night before your surgery  14. DO NOT SLEEP WITH PETS.   Day of Surgery: Wear  Clean/Comfortable clothing the morning of surgery Do not apply any deodorants/lotions.   Remember to brush your teeth WITH YOUR REGULAR TOOTHPASTE.   Please read over the following fact sheets that you were given.

## 2020-06-21 ENCOUNTER — Encounter: Payer: Self-pay | Admitting: Surgical

## 2020-06-21 NOTE — Progress Notes (Signed)
Surgical Clearance has been received from Loura Back, NP for patient's upcoming surgery closure of complex scalp wound with adjacent tissue transfer, possible split-thickness skin graft with Dr. Arita Miss after removal of skull hardware by neurosurgery. Medications to hold prior to surgery Vascepa (Stop taking: 06/22/2020 begin retaking 07/02/2020)    I called the patient to inform her, she reports that she was already aware as she discussed this with Fatima Blank, NP.

## 2020-06-23 ENCOUNTER — Other Ambulatory Visit (HOSPITAL_COMMUNITY)
Admission: RE | Admit: 2020-06-23 | Discharge: 2020-06-23 | Disposition: A | Discharge status: Home / Self Care | Payer: Medicare Other | Source: Ambulatory Visit | Attending: Neurological Surgery | Admitting: Neurological Surgery

## 2020-06-23 DIAGNOSIS — Z20822 Contact with and (suspected) exposure to covid-19: Secondary | ICD-10-CM | POA: Insufficient documentation

## 2020-06-23 DIAGNOSIS — Z01812 Encounter for preprocedural laboratory examination: Secondary | ICD-10-CM | POA: Insufficient documentation

## 2020-06-24 LAB — SARS CORONAVIRUS 2 (TAT 6-24 HRS): SARS Coronavirus 2: NEGATIVE

## 2020-06-25 ENCOUNTER — Encounter: Payer: Self-pay | Admitting: Surgical

## 2020-06-25 NOTE — Progress Notes (Signed)
Surgical Clearance has been received from Loura Back, NP for patient's upcoming surgery with Dr. Arita Miss and Neurosurgery . Medications to hold prior to surgery Vascepa (Stop taking: 06/22/20 Begin retaking 07/02/20)  Patient is aware to hold medicine.

## 2020-06-26 ENCOUNTER — Inpatient Hospital Stay (HOSPITAL_COMMUNITY): Payer: Medicare Other | Admitting: Anesthesiology

## 2020-06-26 ENCOUNTER — Encounter (HOSPITAL_COMMUNITY)
Admission: RE | Disposition: A | Discharge status: Home / Self Care | Payer: Self-pay | Source: Home / Self Care | Attending: Neurological Surgery

## 2020-06-26 ENCOUNTER — Inpatient Hospital Stay (HOSPITAL_COMMUNITY)
Admission: RE | Admit: 2020-06-26 | Discharge: 2020-06-26 | DRG: 905 | Disposition: A | Discharge status: Home / Self Care | Payer: Medicare Other | Attending: Neurological Surgery | Admitting: Neurological Surgery

## 2020-06-26 ENCOUNTER — Encounter (HOSPITAL_COMMUNITY): Payer: Self-pay | Admitting: Neurological Surgery

## 2020-06-26 DIAGNOSIS — Z20822 Contact with and (suspected) exposure to covid-19: Secondary | ICD-10-CM | POA: Diagnosis present

## 2020-06-26 DIAGNOSIS — E785 Hyperlipidemia, unspecified: Secondary | ICD-10-CM | POA: Diagnosis present

## 2020-06-26 DIAGNOSIS — G500 Trigeminal neuralgia: Secondary | ICD-10-CM | POA: Diagnosis present

## 2020-06-26 DIAGNOSIS — Z87891 Personal history of nicotine dependence: Secondary | ICD-10-CM

## 2020-06-26 DIAGNOSIS — Y831 Surgical operation with implant of artificial internal device as the cause of abnormal reaction of the patient, or of later complication, without mention of misadventure at the time of the procedure: Secondary | ICD-10-CM | POA: Diagnosis present

## 2020-06-26 DIAGNOSIS — T8189XA Other complications of procedures, not elsewhere classified, initial encounter: Secondary | ICD-10-CM | POA: Diagnosis present

## 2020-06-26 DIAGNOSIS — Z945 Skin transplant status: Secondary | ICD-10-CM

## 2020-06-26 DIAGNOSIS — M952 Other acquired deformity of head: Secondary | ICD-10-CM | POA: Diagnosis not present

## 2020-06-26 DIAGNOSIS — T84498A Other mechanical complication of other internal orthopedic devices, implants and grafts, initial encounter: Secondary | ICD-10-CM | POA: Diagnosis present

## 2020-06-26 HISTORY — PX: RETROSIGMOID CRANIECTOMY FOR TUMOR RESECTION: SHX6072

## 2020-06-26 HISTORY — PX: ADJACENT TISSUE TRANSFER/TISSUE REARRANGEMENT: SHX6829

## 2020-06-26 LAB — ABO/RH: ABO/RH(D): A POS

## 2020-06-26 SURGERY — RETROSIGMOID CRANIECTOMY FOR TUMOR RESECTION
Anesthesia: General | Site: Head

## 2020-06-26 MED ORDER — PROPOFOL 10 MG/ML IV BOLUS
INTRAVENOUS | Status: DC | PRN
  Administered 2020-06-26: 100 mg via INTRAVENOUS
  Administered 2020-06-26: 50 mg via INTRAVENOUS

## 2020-06-26 MED ORDER — CEFAZOLIN SODIUM-DEXTROSE 2-4 GM/100ML-% IV SOLN
INTRAVENOUS | Status: AC
  Filled 2020-06-26: qty 100

## 2020-06-26 MED ORDER — SODIUM CHLORIDE 0.9 % IV SOLN
Freq: Once | INTRAVENOUS | Status: DC
  Filled 2020-06-26 (×2): qty 30

## 2020-06-26 MED ORDER — DEXAMETHASONE SODIUM PHOSPHATE 10 MG/ML IJ SOLN
INTRAMUSCULAR | Status: AC
  Filled 2020-06-26: qty 1

## 2020-06-26 MED ORDER — GABAPENTIN 300 MG PO CAPS
300.0000 mg | ORAL_CAPSULE | Freq: Once | ORAL | Status: AC
  Administered 2020-06-26: 300 mg via ORAL
  Filled 2020-06-26: qty 1

## 2020-06-26 MED ORDER — CHLORHEXIDINE GLUCONATE 0.12 % MT SOLN
OROMUCOSAL | Status: AC
  Administered 2020-06-26: 15 mL via OROMUCOSAL
  Filled 2020-06-26: qty 15

## 2020-06-26 MED ORDER — LIDOCAINE 2% (20 MG/ML) 5 ML SYRINGE
INTRAMUSCULAR | Status: DC | PRN
  Administered 2020-06-26: 60 mg via INTRAVENOUS

## 2020-06-26 MED ORDER — LIDOCAINE-EPINEPHRINE 1 %-1:100000 IJ SOLN
INTRAMUSCULAR | Status: DC | PRN
  Administered 2020-06-26: 3 mL

## 2020-06-26 MED ORDER — ONDANSETRON HCL 4 MG/2ML IJ SOLN
INTRAMUSCULAR | Status: AC
  Filled 2020-06-26: qty 2

## 2020-06-26 MED ORDER — THROMBIN 5000 UNITS EX SOLR
CUTANEOUS | Status: AC
  Filled 2020-06-26: qty 5000

## 2020-06-26 MED ORDER — AMISULPRIDE (ANTIEMETIC) 5 MG/2ML IV SOLN: 10.0000 mg | Freq: Once | INTRAVENOUS | Status: DC | PRN

## 2020-06-26 MED ORDER — LACTATED RINGERS IV SOLN
INTRAVENOUS | Status: DC | PRN
  Administered 2020-06-26: 120 mL

## 2020-06-26 MED ORDER — LIDOCAINE 2% (20 MG/ML) 5 ML SYRINGE
INTRAMUSCULAR | Status: AC
  Filled 2020-06-26: qty 5

## 2020-06-26 MED ORDER — ORAL CARE MOUTH RINSE: 15.0000 mL | Freq: Once | OROMUCOSAL | Status: AC

## 2020-06-26 MED ORDER — THROMBIN 5000 UNITS EX SOLR
OROMUCOSAL | Status: DC | PRN
  Administered 2020-06-26: 5 mL via TOPICAL

## 2020-06-26 MED ORDER — CHLORHEXIDINE GLUCONATE 0.12 % MT SOLN: 15.0000 mL | Freq: Once | OROMUCOSAL | Status: AC

## 2020-06-26 MED ORDER — CHLORHEXIDINE GLUCONATE CLOTH 2 % EX PADS: 6.0000 | MEDICATED_PAD | Freq: Once | CUTANEOUS | Status: DC

## 2020-06-26 MED ORDER — SUGAMMADEX SODIUM 200 MG/2ML IV SOLN
INTRAVENOUS | Status: DC | PRN
  Administered 2020-06-26: 150 mg via INTRAVENOUS

## 2020-06-26 MED ORDER — LIDOCAINE-EPINEPHRINE 1 %-1:100000 IJ SOLN
INTRAMUSCULAR | Status: AC
  Filled 2020-06-26: qty 1

## 2020-06-26 MED ORDER — CEFAZOLIN SODIUM-DEXTROSE 2-4 GM/100ML-% IV SOLN
2.0000 g | INTRAVENOUS | Status: AC
  Administered 2020-06-26: 2 g via INTRAVENOUS

## 2020-06-26 MED ORDER — 0.9 % SODIUM CHLORIDE (POUR BTL) OPTIME
TOPICAL | Status: DC | PRN
  Administered 2020-06-26 (×3): 1000 mL

## 2020-06-26 MED ORDER — BACITRACIN ZINC 500 UNIT/GM EX OINT
TOPICAL_OINTMENT | CUTANEOUS | Status: DC | PRN
  Administered 2020-06-26 (×2): 1 via TOPICAL

## 2020-06-26 MED ORDER — PROPOFOL 500 MG/50ML IV EMUL
INTRAVENOUS | Status: DC | PRN
Start: 2020-06-26 — End: 2020-06-26
  Administered 2020-06-26: 50 ug/kg/min via INTRAVENOUS

## 2020-06-26 MED ORDER — BACITRACIN ZINC 500 UNIT/GM EX OINT
TOPICAL_OINTMENT | CUTANEOUS | Status: AC
  Filled 2020-06-26: qty 28.35

## 2020-06-26 MED ORDER — FENTANYL CITRATE (PF) 250 MCG/5ML IJ SOLN
INTRAMUSCULAR | Status: DC | PRN
  Administered 2020-06-26: 100 ug via INTRAVENOUS
  Administered 2020-06-26 (×2): 50 ug via INTRAVENOUS

## 2020-06-26 MED ORDER — BUPIVACAINE-EPINEPHRINE (PF) 0.25% -1:200000 IJ SOLN
INTRAMUSCULAR | Status: AC
  Filled 2020-06-26: qty 30

## 2020-06-26 MED ORDER — ACETAMINOPHEN 500 MG PO TABS
1000.0000 mg | ORAL_TABLET | Freq: Once | ORAL | Status: AC
  Administered 2020-06-26: 1000 mg via ORAL
  Filled 2020-06-26: qty 2

## 2020-06-26 MED ORDER — PHENYLEPHRINE HCL-NACL 10-0.9 MG/250ML-% IV SOLN
INTRAVENOUS | Status: AC
  Filled 2020-06-26: qty 250

## 2020-06-26 MED ORDER — HYDROCODONE-ACETAMINOPHEN 5-325 MG PO TABS: 1.0000 | ORAL_TABLET | ORAL | 0 refills | Status: DC | PRN

## 2020-06-26 MED ORDER — PROPOFOL 10 MG/ML IV BOLUS
INTRAVENOUS | Status: AC
  Filled 2020-06-26: qty 20

## 2020-06-26 MED ORDER — ONDANSETRON HCL 4 MG/2ML IJ SOLN
INTRAMUSCULAR | Status: DC | PRN
  Administered 2020-06-26: 4 mg via INTRAVENOUS

## 2020-06-26 MED ORDER — SODIUM CHLORIDE 0.9 % IV SOLN
INTRAVENOUS | Status: DC | PRN
Start: 2020-06-26 — End: 2020-06-26

## 2020-06-26 MED ORDER — DEXAMETHASONE SODIUM PHOSPHATE 10 MG/ML IJ SOLN
INTRAMUSCULAR | Status: DC | PRN
  Administered 2020-06-26: 10 mg via INTRAVENOUS

## 2020-06-26 MED ORDER — PHENYLEPHRINE HCL-NACL 10-0.9 MG/250ML-% IV SOLN
INTRAVENOUS | Status: DC | PRN
  Administered 2020-06-26: 25 ug/min via INTRAVENOUS

## 2020-06-26 MED ORDER — FENTANYL CITRATE (PF) 100 MCG/2ML IJ SOLN: 25.0000 ug | INTRAMUSCULAR | Status: DC | PRN

## 2020-06-26 MED ORDER — PROPOFOL 1000 MG/100ML IV EMUL
INTRAVENOUS | Status: AC
  Filled 2020-06-26: qty 100

## 2020-06-26 MED ORDER — ROCURONIUM BROMIDE 10 MG/ML (PF) SYRINGE
PREFILLED_SYRINGE | INTRAVENOUS | Status: DC | PRN
  Administered 2020-06-26: 30 mg via INTRAVENOUS
  Administered 2020-06-26: 40 mg via INTRAVENOUS

## 2020-06-26 MED ORDER — FENTANYL CITRATE (PF) 250 MCG/5ML IJ SOLN
INTRAMUSCULAR | Status: AC
  Filled 2020-06-26: qty 5

## 2020-06-26 MED ORDER — LACTATED RINGERS IV SOLN: INTRAVENOUS | Status: DC

## 2020-06-26 SURGICAL SUPPLY — 128 items
BAND RUBBER #18 3X1/16 STRL (MISCELLANEOUS) IMPLANT
BENZOIN TINCTURE PRP APPL 2/3 (GAUZE/BANDAGES/DRESSINGS) IMPLANT
BLADE CLIPPER SURG (BLADE) ×2 IMPLANT
BLADE SAW GIGLI 16 STRL (MISCELLANEOUS) IMPLANT
BLADE SURG 15 STRL LF DISP TIS (BLADE) IMPLANT
BLADE SURG 15 STRL SS (BLADE)
BLADE ULTRA TIP 2M (BLADE) IMPLANT
BNDG COHESIVE 4X5 TAN STRL (GAUZE/BANDAGES/DRESSINGS) IMPLANT
BNDG GAUZE ELAST 4 BULKY (GAUZE/BANDAGES/DRESSINGS) IMPLANT
BNDG STRETCH 4X75 STRL LF (GAUZE/BANDAGES/DRESSINGS) IMPLANT
BUR ACORN 9.0 PRECISION (BURR) IMPLANT
BUR ROUND FLUTED 4 SOFT TCH (BURR) IMPLANT
BUR ROUND FLUTED 5 RND (BURR) IMPLANT
BUR SPIRAL ROUTER 2.3 (BUR) ×2 IMPLANT
CANISTER SUCT 1200ML W/VALVE (MISCELLANEOUS) IMPLANT
CANISTER SUCT 3000ML PPV (MISCELLANEOUS) ×4 IMPLANT
CATH VENTRIC 35X38 W/TROCAR LG (CATHETERS) IMPLANT
CHLORAPREP W/TINT 26 (MISCELLANEOUS) IMPLANT
CLIP VESOCCLUDE MED 6/CT (CLIP) IMPLANT
CNTNR URN SCR LID CUP LEK RST (MISCELLANEOUS) ×1 IMPLANT
CONT SPEC 4OZ STRL OR WHT (MISCELLANEOUS) ×1
COVER BACK TABLE 60X90IN (DRAPES) IMPLANT
COVER MAYO STAND STRL (DRAPES) IMPLANT
COVER SURGICAL LIGHT HANDLE (MISCELLANEOUS) IMPLANT
COVER WAND RF STERILE (DRAPES) ×2 IMPLANT
DECANTER SPIKE VIAL GLASS SM (MISCELLANEOUS) IMPLANT
DERMABOND ADVANCED (GAUZE/BANDAGES/DRESSINGS)
DERMABOND ADVANCED .7 DNX12 (GAUZE/BANDAGES/DRESSINGS) IMPLANT
DRAIN CHANNEL 15F RND FF W/TCR (WOUND CARE) IMPLANT
DRAIN PENROSE 1/4X12 LTX STRL (WOUND CARE) ×2 IMPLANT
DRAIN SUBARACHNOID (WOUND CARE) IMPLANT
DRAPE HALF SHEET 40X57 (DRAPES) IMPLANT
DRAPE MICROSCOPE LEICA (MISCELLANEOUS) IMPLANT
DRAPE NEUROLOGICAL W/INCISE (DRAPES) ×2 IMPLANT
DRAPE STERI IOBAN 125X83 (DRAPES) IMPLANT
DRAPE SURG 17X23 STRL (DRAPES) IMPLANT
DRAPE U-SHAPE 76X120 STRL (DRAPES) IMPLANT
DRAPE WARM FLUID 44X44 (DRAPES) ×2 IMPLANT
DRSG ADAPTIC 3X8 NADH LF (GAUZE/BANDAGES/DRESSINGS) IMPLANT
DRSG CALCIUM ALGINATE 4X4 (GAUZE/BANDAGES/DRESSINGS) IMPLANT
DRSG PAD ABDOMINAL 8X10 ST (GAUZE/BANDAGES/DRESSINGS) ×2 IMPLANT
DRSG TELFA 3X8 NADH (GAUZE/BANDAGES/DRESSINGS) IMPLANT
DURAPREP 6ML APPLICATOR 50/CS (WOUND CARE) ×2 IMPLANT
ELECT CAUTERY BLADE 6.4 (BLADE) IMPLANT
ELECT COATED BLADE 2.86 ST (ELECTRODE) IMPLANT
ELECT NEEDLE BLADE 2-5/6 (NEEDLE) IMPLANT
ELECT REM PT RETURN 9FT ADLT (ELECTROSURGICAL)
ELECTRODE REM PT RTRN 9FT ADLT (ELECTROSURGICAL) IMPLANT
EVACUATOR 1/8 PVC DRAIN (DRAIN) IMPLANT
EVACUATOR SILICONE 100CC (DRAIN) IMPLANT
FELT TEFLON 6X6 (MISCELLANEOUS) IMPLANT
FORCEPS BIPOLAR SPETZLER 8 1.0 (NEUROSURGERY SUPPLIES) IMPLANT
GAUZE 4X4 16PLY RFD (DISPOSABLE) IMPLANT
GAUZE SPONGE 4X4 12PLY STRL (GAUZE/BANDAGES/DRESSINGS) ×2 IMPLANT
GAUZE XEROFORM 1X8 LF (GAUZE/BANDAGES/DRESSINGS) IMPLANT
GLOVE BIOGEL M STRL SZ7.5 (GLOVE) ×2 IMPLANT
GLOVE BIOGEL PI IND STRL 6 (GLOVE) ×2 IMPLANT
GLOVE BIOGEL PI IND STRL 7.5 (GLOVE) ×1 IMPLANT
GLOVE BIOGEL PI INDICATOR 6 (GLOVE) ×2
GLOVE BIOGEL PI INDICATOR 7.5 (GLOVE) ×1
GLOVE ECLIPSE 7.0 STRL STRAW (GLOVE) ×2 IMPLANT
GLOVE ECLIPSE 7.5 STRL STRAW (GLOVE) ×4 IMPLANT
GLOVE EXAM NITRILE XL STR (GLOVE) IMPLANT
GLOVE INDICATOR 8.0 STRL GRN (GLOVE) ×2 IMPLANT
GOWN STRL REUS W/ TWL LRG LVL3 (GOWN DISPOSABLE) ×3 IMPLANT
GOWN STRL REUS W/ TWL XL LVL3 (GOWN DISPOSABLE) IMPLANT
GOWN STRL REUS W/TWL 2XL LVL3 (GOWN DISPOSABLE) IMPLANT
GOWN STRL REUS W/TWL LRG LVL3 (GOWN DISPOSABLE) ×3
GOWN STRL REUS W/TWL XL LVL3 (GOWN DISPOSABLE)
HEMOSTAT POWDER KIT SURGIFOAM (HEMOSTASIS) ×2 IMPLANT
HEMOSTAT SURGICEL 2X14 (HEMOSTASIS) ×2 IMPLANT
IV NS 1000ML (IV SOLUTION)
IV NS 1000ML BAXH (IV SOLUTION) IMPLANT
KIT BASIN OR (CUSTOM PROCEDURE TRAY) ×2 IMPLANT
KIT DRAIN CSF ACCUDRAIN (MISCELLANEOUS) IMPLANT
KIT TURNOVER KIT B (KITS) ×2 IMPLANT
KNIFE ARACHNOID DISP AM-24-S (MISCELLANEOUS) IMPLANT
NEEDLE HYPO 22GX1.5 SAFETY (NEEDLE) ×2 IMPLANT
NEEDLE HYPO 25X1 1.5 SAFETY (NEEDLE) ×2 IMPLANT
NEEDLE SPNL 18GX3.5 QUINCKE PK (NEEDLE) ×2 IMPLANT
NS IRRIG 1000ML POUR BTL (IV SOLUTION) ×6 IMPLANT
PACK CRANIOTOMY CUSTOM (CUSTOM PROCEDURE TRAY) ×2 IMPLANT
PATTIES SURGICAL .25X.25 (GAUZE/BANDAGES/DRESSINGS) IMPLANT
PATTIES SURGICAL .5 X.5 (GAUZE/BANDAGES/DRESSINGS) IMPLANT
PATTIES SURGICAL .5 X3 (DISPOSABLE) IMPLANT
PATTIES SURGICAL 1/4 X 3 (GAUZE/BANDAGES/DRESSINGS) IMPLANT
PATTIES SURGICAL 1X1 (DISPOSABLE) IMPLANT
PENCIL SMOKE EVACUATOR (MISCELLANEOUS) IMPLANT
PIN MAYFIELD SKULL DISP (PIN) IMPLANT
PIN SAFETY STERILE (MISCELLANEOUS) IMPLANT
SHEET MEDIUM DRAPE 40X70 STRL (DRAPES) IMPLANT
SPECIMEN JAR SMALL (MISCELLANEOUS) IMPLANT
SPONGE LAP 18X18 RF (DISPOSABLE) IMPLANT
SPONGE NEURO XRAY DETECT 1X3 (DISPOSABLE) IMPLANT
SPONGE SURGIFOAM ABS GEL 100 (HEMOSTASIS) IMPLANT
STAPLER VISISTAT 35W (STAPLE) ×2 IMPLANT
STOCKINETTE 6  STRL (DRAPES) ×1
STOCKINETTE 6 STRL (DRAPES) ×1 IMPLANT
STOCKINETTE TUBULAR 6 INCH (GAUZE/BANDAGES/DRESSINGS) ×2 IMPLANT
STRIP CLOSURE SKIN 1/2X4 (GAUZE/BANDAGES/DRESSINGS) IMPLANT
SUT ETHILON 2 0 FS 18 (SUTURE) IMPLANT
SUT ETHILON 3 0 FSL (SUTURE) IMPLANT
SUT ETHILON 3 0 PS 1 (SUTURE) IMPLANT
SUT MNCRL AB 3-0 PS2 18 (SUTURE) IMPLANT
SUT MNCRL AB 4-0 PS2 18 (SUTURE) IMPLANT
SUT MON AB 4-0 PC3 18 (SUTURE) IMPLANT
SUT MON AB 5-0 P3 18 (SUTURE) IMPLANT
SUT MON AB 5-0 PS2 18 (SUTURE) IMPLANT
SUT NURALON 4 0 TR CR/8 (SUTURE) IMPLANT
SUT PDS AB 2-0 CT2 27 (SUTURE) IMPLANT
SUT PDS AB 3-0 SH 27 (SUTURE) ×6 IMPLANT
SUT PLAIN 5 0 P 3 18 (SUTURE) IMPLANT
SUT SILK 0 TIES 10X30 (SUTURE) IMPLANT
SUT SILK 2 0 SH (SUTURE) IMPLANT
SUT VIC AB 2-0 CP2 18 (SUTURE) IMPLANT
SUT VIC AB 3-0 PS1 18 (SUTURE)
SUT VIC AB 3-0 PS1 18XBRD (SUTURE) IMPLANT
SUT VICRYL 4-0 PS2 18IN ABS (SUTURE) IMPLANT
SYR 20ML LL LF (SYRINGE) ×4 IMPLANT
SYR CONTROL 10ML LL (SYRINGE) IMPLANT
TOWEL GREEN STERILE (TOWEL DISPOSABLE) ×2 IMPLANT
TOWEL GREEN STERILE FF (TOWEL DISPOSABLE) ×2 IMPLANT
TRAY ENT MC OR (CUSTOM PROCEDURE TRAY) IMPLANT
TRAY FOLEY MTR SLVR 16FR STAT (SET/KITS/TRAYS/PACK) IMPLANT
TUBE CONNECTING 12X1/4 (SUCTIONS) ×2 IMPLANT
TUBE CONNECTING 20X1/4 (TUBING) IMPLANT
UNDERPAD 30X36 HEAVY ABSORB (UNDERPADS AND DIAPERS) IMPLANT
WATER STERILE IRR 1000ML POUR (IV SOLUTION) ×2 IMPLANT

## 2020-06-26 NOTE — Anesthesia Preprocedure Evaluation (Addendum)
Anesthesia Evaluation  Patient identified by MRN, date of birth, ID band Patient awake    Reviewed: Allergy & Precautions, NPO status , Patient's Chart, lab work & pertinent test results  Airway Mallampati: II  TM Distance: >3 FB     Dental  (+) Dental Advisory Given   Pulmonary former smoker,    breath sounds clear to auscultation       Cardiovascular negative cardio ROS   Rhythm:Regular Rate:Normal     Neuro/Psych Trigeminal neuralgia  Neuromuscular disease    GI/Hepatic negative GI ROS, Neg liver ROS,   Endo/Other  negative endocrine ROS  Renal/GU negative Renal ROS     Musculoskeletal   Abdominal   Peds  Hematology negative hematology ROS (+)   Anesthesia Other Findings   Reproductive/Obstetrics                             Anesthesia Physical Anesthesia Plan  ASA: II  Anesthesia Plan: General   Post-op Pain Management:    Induction: Intravenous  PONV Risk Score and Plan: 3 and Dexamethasone, Ondansetron and Treatment may vary due to age or medical condition  Airway Management Planned: Oral ETT  Additional Equipment: None  Intra-op Plan:   Post-operative Plan: Extubation in OR  Informed Consent: I have reviewed the patients History and Physical, chart, labs and discussed the procedure including the risks, benefits and alternatives for the proposed anesthesia with the patient or authorized representative who has indicated his/her understanding and acceptance.     Dental advisory given  Plan Discussed with: CRNA  Anesthesia Plan Comments:         Anesthesia Quick Evaluation

## 2020-06-26 NOTE — Interval H&P Note (Signed)
Patient seen and examined. Risks and benefits discussed. Proceed with surgery.

## 2020-06-26 NOTE — Progress Notes (Signed)
Neurosurgery Service Post-operative progress note  Assessment & Plan: 75 y.o. woman s/p complex scalp wound revision, seen in PACU, doing great, some scalp soreness but otherwise no complaints, at neurologic baseline.   -okay to discharge home from PACU, will place instructions / orders  Jadene Pierini  06/26/20 2:48 PM

## 2020-06-26 NOTE — Anesthesia Procedure Notes (Signed)
Arterial Line Insertion Start/End2/15/2022 11:24 AM, 06/26/2020 11:30 AM Performed by: Marcene Duos, MD, Mayer Camel, CRNA, CRNA  Patient location: Pre-op. Preanesthetic checklist: patient identified, IV checked, site marked, risks and benefits discussed, surgical consent, monitors and equipment checked, pre-op evaluation, timeout performed and anesthesia consent Lidocaine 1% used for infiltration radial was placed Catheter size: 20 G Hand hygiene performed , maximum sterile barriers used  and Seldinger technique used Allen's test indicative of satisfactory collateral circulation Attempts: 1 Procedure performed without using ultrasound guided technique. Following insertion, dressing applied and Biopatch. Post procedure assessment: normal  Patient tolerated the procedure well with no immediate complications. Additional procedure comments: Arterial line placed by Dianna Rossetti, SRNA and Noel Christmas, CRNA.Marland Kitchen

## 2020-06-26 NOTE — Brief Op Note (Signed)
06/26/2020  2:18 PM  PATIENT:  Ellen Griffith  75 y.o. female  PRE-OPERATIVE DIAGNOSIS:  Trigeminal neuralgia  POST-OPERATIVE DIAGNOSIS:  Trigeminal neuralgia  PROCEDURE:  Procedure(s): Retrosigmoid craniectomy for removal of plate (N/A) CLOSURE OF COMPLEX SCALP WOUND WITH ADJACENT TISSUE TRANSFER (N/A)  SURGEON:  Surgeon(s) and Role: Panel 1:    * Jadene Pierini, MD - Primary Panel 2:    * Allena Napoleon, MD - Primary  PHYSICIAN ASSISTANT: Materials engineer, PA  ASSISTANTS: none   ANESTHESIA:   general  EBL:  25 mL   BLOOD ADMINISTERED:none  DRAINS: Penrose drain in the scalp   LOCAL MEDICATIONS USED:  MARCAINE     SPECIMEN:  No Specimen  DISPOSITION OF SPECIMEN:  N/A  COUNTS:  YES  TOURNIQUET:  * No tourniquets in log *  DICTATION: .Dragon Dictation  PLAN OF CARE: Discharge to home after PACU  PATIENT DISPOSITION:  PACU - hemodynamically stable.   Delay start of Pharmacological VTE agent (>24hrs) due to surgical blood loss or risk of bleeding: not applicable

## 2020-06-26 NOTE — Transfer of Care (Signed)
Immediate Anesthesia Transfer of Care Note  Patient: Ellen Griffith  Procedure(s) Performed: Retrosigmoid craniectomy for removal of plate (N/A Head) CLOSURE OF COMPLEX SCALP WOUND WITH ADJACENT TISSUE TRANSFER (N/A Head)  Patient Location: PACU  Anesthesia Type:General  Level of Consciousness: awake, alert  and oriented  Airway & Oxygen Therapy: Patient Spontanous Breathing and Patient connected to face mask oxygen  Post-op Assessment: Report given to RN and Post -op Vital signs reviewed and stable  Post vital signs: Reviewed and stable  Last Vitals:  Vitals Value Taken Time  BP 112/54 06/26/20 1430  Temp 36.6 C 06/26/20 1430  Pulse 74 06/26/20 1433  Resp 17 06/26/20 1433  SpO2 100 % 06/26/20 1433  Vitals shown include unvalidated device data.  Last Pain:  Vitals:   06/26/20 1430  TempSrc:   PainSc: 0-No pain         Complications: No complications documented.

## 2020-06-26 NOTE — Op Note (Signed)
Operative Note   DATE OF OPERATION: 06/26/2020  SURGICAL DEPARTMENT: Plastic Surgery  PREOPERATIVE DIAGNOSES:  Exposed cranial hardware  POSTOPERATIVE DIAGNOSES:  same  PROCEDURE:  1. Surgical preparation for coverage of right posterior scalp defect 5x4 cm 2.  Closure of right posterior scalp defect with adjacent tissue transfer totaling 10x12 cm including the primary and secondary defect  SURGEON: Ancil Linsey, MD  ASSISTANT: Zadie Cleverly, PA The advanced practice practitioner (APP) assisted throughout the case.  The APP was essential in retraction and counter traction when needed to make the case progress smoothly.  This retraction and assistance made it possible to see the tissue plans for the procedure.  The assistance was needed for blood control, tissue re-approximation and assisted with closure of the incision site.  ANESTHESIA:  General.   COMPLICATIONS: None.   INDICATIONS FOR PROCEDURE:  The patient, Ellen Griffith is a 75 y.o. female born on 1946/03/26, is here for treatment of complex posterior scalp defect. MRN: [id number]  CONSENT:  Informed consent was obtained directly from the patient. Risks, benefits and alternatives were fully discussed. Specific risks including but not limited to bleeding, infection, hematoma, seroma, scarring, pain, contracture, asymmetry, wound healing problems, and need for further surgery were all discussed. The patient did have an ample opportunity to have questions answered to satisfaction.   DESCRIPTION OF PROCEDURE:  The patient was taken to the operating room. SCDs were placed and antibiotics were given. General anesthesia was administered.  The patient's operative site was prepped and draped in a sterile fashion. A time out was performed and all information was confirmed to be correct.  Dr. Johnsie Cancel removed her plate and hardware without issue and the patient was turned over to me.  I started by infiltrating tumescent solution for  pain control and hemostasis.  I then prepared the wound for coverage by sharply debriding the skin edges with a 15 blade.  There was an underlying cranial defect with intact dura.  Surrounding bone appeared healthy.  A rotational flap was then elevated with a 10 blade.  Surrounding scalp was then undermined in a suprapericranial plane.  Hemostasis was obtained and a penrose drain was placed.  Flap was advanced and closed in layers with 3-0 pds sutures.  This gave a nice on table result.  Soft dressing was applied.  The patient tolerated the procedure well.  There were no complications. The patient was allowed to wake from anesthesia, extubated and taken to the recovery room in satisfactory condition.

## 2020-06-26 NOTE — Anesthesia Procedure Notes (Signed)
Procedure Name: Intubation Date/Time: 06/26/2020 12:11 PM Performed by: Alain Marion, CRNA Pre-anesthesia Checklist: Patient identified, Emergency Drugs available, Suction available and Patient being monitored Patient Re-evaluated:Patient Re-evaluated prior to induction Oxygen Delivery Method: Circle system utilized Preoxygenation: Pre-oxygenation with 100% oxygen Induction Type: IV induction Ventilation: Mask ventilation without difficulty and Oral airway inserted - appropriate to patient size Laryngoscope Size: Mac and 3 Grade View: Grade II Tube type: Oral Tube size: 7.0 mm Number of attempts: 1 Airway Equipment and Method: Stylet and Oral airway Placement Confirmation: ETT inserted through vocal cords under direct vision,  positive ETCO2 and breath sounds checked- equal and bilateral Secured at: 22 cm Tube secured with: Tape Dental Injury: Teeth and Oropharynx as per pre-operative assessment  Comments: Atraumatic oral intubation by Dorene Grebe, SRNA.

## 2020-06-26 NOTE — H&P (Signed)
Surgical H&P Update  HPI: 75 y.o. woman with h/o prior MVD for TGN with breakdown over the plate in a delayed fashion. No new symptoms or changes in health since she was last seen.   PMHx:  Past Medical History:  Diagnosis Date  . Bunion    Bilateral  . Complication of anesthesia    nausea  . Family history of adverse reaction to anesthesia    Mother experience ARDS after heart surgery (2nd valve replacement)  . Pneumonia 05/08/2021   bacterial   . Trigeminal neuralgia    FamHx:  Family History  Problem Relation Age of Onset  . Heart disease Mother        valve problems secondary to rheumatic heart disease  . Migraines Daughter    SocHx:  reports that she quit smoking about 2 years ago. Her smoking use included cigarettes. She has never used smokeless tobacco. She reports current alcohol use. She reports that she does not use drugs.  Physical Exam: Awake/alert, FS, FCx4, breakdown over R retrosig plate, no active purulence  Assesment/Plan: 75 y.o. woman with breakdown over R retrosig plate, here for removal of plate, complex closure w/ plastics. Risks, benefits, and alternatives discussed and the patient would like to continue with surgery.  -OR today -home vs 3C post-op  Jadene Pierini, MD 06/26/20 11:52 AM

## 2020-06-26 NOTE — Op Note (Signed)
PATIENT: Ellen Griffith  DAY OF SURGERY: 06/26/20   PRE-OPERATIVE DIAGNOSIS:  Wound breakdown over cranial plate   POST-OPERATIVE DIAGNOSIS:  Same   PROCEDURE:  Revision of right retrosigmoid wound, removal of cranial mesh   SURGEON:  Surgeon(s) and Role:    Tannon Peerson, Clovis Pu, MD - Co-surgeon    Merry Proud, MD - Co-surgeon   ANESTHESIA: ETGA   BRIEF HISTORY: This is a 75 year old woman who presented with an exposed plate in the right retrosigmoid region. She previously had an MVD with conversion of type 1 TGN to type 2. She then had some erosion around the plate and had what sounds like a wound revision with recurrence of the wound breakdown. I therefore recommended removal of the plate to get rid of the foreign material in a joint case with complex closure / reconstruction by Dr. Arita Miss from plastics. This was discussed with the patient as well as risks, benefits, and alternatives and wished to proceed with surgery.   OPERATIVE DETAIL: The patient was taken to the operating room and placed on the OR table in the supine position on a horseshoe head holder. A formal time out was performed with two patient identifiers and confirmed the operative site. Anesthesia was induced by the anesthesia team. The operative site was marked, hair was clipped with surgical clippers, the area was then prepped and draped in a sterile fashion.   The wound edges of the defect were dissected and elevated. The size of the craniectomy was large for a standard retrosigmoid for benign pathology, but the plate was quite large - roughly 6x4cm. I dissected it circumferentially and removed the screws and then plate. The adjacent bone was healthy appearing, no evidence of osteomyelitis. There was no CSF leak, so I did not disturb the chronic / scarred material in the epidural space in the area of the bone defect and copiously irrigated the wound. Dr. Arita Miss then performed a complex reconstruction of the wound.   EBL:  89mL    SPECIMENS: none   Jadene Pierini, MD 06/26/20 11:54 AM

## 2020-06-26 NOTE — Discharge Instructions (Addendum)
Discharge Instructions  Activity As tolerated: NO showers for 3 days, after 3 days you can shower and gently wash your hair if you'd like. No heavy activities  Diet: Regular  Wound Care: Keep dressing clean & dry. Keep gauze over the drain that was placed. You can wear a stockinette or beanie to hold the dressings in place.   Call Doctor if any unusual problems occur such as pain, excessive Bleeding, unrelieved Nausea/vomiting, Fever &/or chills  Follow-up appointment with Dr. Arita Miss: Keep your scheduled follow up appointment with Dr. Arita Miss for next week.  Follow up with Dr. Maurice Small in 2 weeks after discharge. If you do not already have a discharge appointment, please call his office at [phone] to schedule a follow up appointment. If you have any concerns or questions, please call the office and let us know.

## 2020-06-27 ENCOUNTER — Encounter (HOSPITAL_COMMUNITY): Payer: Self-pay | Admitting: Neurological Surgery

## 2020-06-27 NOTE — Anesthesia Postprocedure Evaluation (Signed)
Anesthesia Post Note  Patient: Ellen Griffith  Procedure(s) Performed: Retrosigmoid craniectomy for removal of plate (N/A Head) CLOSURE OF COMPLEX SCALP WOUND WITH ADJACENT TISSUE TRANSFER (N/A Head)     Patient location during evaluation: PACU Anesthesia Type: General Level of consciousness: awake and alert Pain management: pain level controlled Vital Signs Assessment: post-procedure vital signs reviewed and stable Respiratory status: spontaneous breathing, nonlabored ventilation, respiratory function stable and patient connected to nasal cannula oxygen Cardiovascular status: blood pressure returned to baseline and stable Postop Assessment: no apparent nausea or vomiting Anesthetic complications: no   No complications documented.  Last Vitals:  Vitals:   06/26/20 1515 06/26/20 1530  BP: 116/65 111/70  Pulse: 71 78  Resp: 15 20  Temp:  36.6 C  SpO2: 98% 99%    Last Pain:  Vitals:   06/26/20 1530  TempSrc:   PainSc: 0-No pain                 Kennieth Rad

## 2020-06-28 DIAGNOSIS — T84498A Other mechanical complication of other internal orthopedic devices, implants and grafts, initial encounter: Secondary | ICD-10-CM | POA: Diagnosis present

## 2020-06-28 NOTE — Discharge Summary (Signed)
Discharge Summary  Date of Admission: 06/26/2020  Date of Discharge: 06/26/20  Attending Physician: Autumn Patty, MD  Hospital Course: Patient was taken to the OR on 06/26/20 for removal of an exposed cranial retrosigmoid plate due to recurrent wound breakdown followed by complex closure of a scalp wound with Dr. Arita Miss from plastics. Given the bed shortage due to Covid and that she was doing well post-op without any intracranial manipulation needed and no evidence of CSF leak, she was discharged home from PACU. She will follow up in clinic with myself and Dr. Arita Miss in clinic after discharge.  Neurologic exam at discharge:  AOx3, PERRL, EOMI, FS, TM Strength 5/5 x4, SILTx4, no drift  Discharge diagnosis: Complex wound of the scalp, post-op, exposed hardware  Jadene Pierini, MD 06/28/20 1:32 PM

## 2020-07-05 ENCOUNTER — Encounter: Payer: Self-pay | Admitting: Plastic Surgery

## 2020-07-05 ENCOUNTER — Other Ambulatory Visit: Payer: Self-pay

## 2020-07-05 ENCOUNTER — Ambulatory Visit (INDEPENDENT_AMBULATORY_CARE_PROVIDER_SITE_OTHER): Payer: Medicare Other | Admitting: Plastic Surgery

## 2020-07-05 VITALS — BP 117/75

## 2020-07-05 DIAGNOSIS — T84498A Other mechanical complication of other internal orthopedic devices, implants and grafts, initial encounter: Secondary | ICD-10-CM

## 2020-07-05 NOTE — Progress Notes (Signed)
Patient presents about 1 week out from removal of cranial hardware and closure with adjacent tissue transfer.  Overall she feels well from the surgery but is still bothered by her trigeminal neuralgia.  On exam the flap looks healthy and looks to be healing fine.  The incisions are intact.  The small Penrose drain that I left was removed today.  We will plan to leave her stitches in and see her again in 2 weeks.  All of her questions were answered.

## 2020-07-19 ENCOUNTER — Encounter: Payer: Self-pay | Admitting: Plastic Surgery

## 2020-07-19 ENCOUNTER — Other Ambulatory Visit: Payer: Self-pay

## 2020-07-19 ENCOUNTER — Ambulatory Visit (INDEPENDENT_AMBULATORY_CARE_PROVIDER_SITE_OTHER): Payer: Medicare Other | Admitting: Plastic Surgery

## 2020-07-19 VITALS — BP 123/77 | HR 55

## 2020-07-19 DIAGNOSIS — T84498A Other mechanical complication of other internal orthopedic devices, implants and grafts, initial encounter: Secondary | ICD-10-CM

## 2020-07-19 MED ORDER — SULFAMETHOXAZOLE-TRIMETHOPRIM 800-160 MG PO TABS: 1.0000 | ORAL_TABLET | Freq: Two times a day (BID) | ORAL | 0 refills | Status: DC

## 2020-07-19 NOTE — Progress Notes (Signed)
Patient presents now about 3 weeks out from removal of exposed cranial hardware and wound closure with adjacent tissue transfer.  She overall feels fine with no fevers but has noted a little bit of drainage from her incision.  I removed a couple of her sutures today and did express a small amount of purulence from her incision superiorly.  There is also scabbing along her incision elsewhere and some of these areas are going to be slow to heal I suspect.  Am planning to put her on Bactrim for the next few weeks and see her again 2 weeks from now.  I explained that she should call us if anything were to worsen in terms of purulence, redness, or fevers.  I will plan to update her neurosurgeon as well.  All of her questions were answered.

## 2020-07-25 ENCOUNTER — Ambulatory Visit: Payer: Medicare Other | Admitting: Neurology

## 2020-08-02 ENCOUNTER — Ambulatory Visit (INDEPENDENT_AMBULATORY_CARE_PROVIDER_SITE_OTHER): Payer: Medicare Other | Admitting: Plastic Surgery

## 2020-08-02 ENCOUNTER — Other Ambulatory Visit: Payer: Self-pay

## 2020-08-02 ENCOUNTER — Encounter: Payer: Self-pay | Admitting: Plastic Surgery

## 2020-08-02 VITALS — BP 102/65 | HR 90

## 2020-08-02 DIAGNOSIS — T84498A Other mechanical complication of other internal orthopedic devices, implants and grafts, initial encounter: Secondary | ICD-10-CM

## 2020-08-02 NOTE — Progress Notes (Signed)
Patient presents about a month postop from removal of cranial hardware and closure with adjacent tissue transfer.  She feels good and has no complaints.  On exam her wound is improving.  I do not detect any purulence.  She has quite a bit of scabbing around the edges that looks to be mostly dried blood.  I did try to remove most of this at the visit today.  There are a few raw areas along the incision line but this looks much improved from her last visit.  I encouraged her to continue showering to keep the area clean and I will plan to see her again in a few weeks.  All of her questions were answered.

## 2020-08-22 ENCOUNTER — Other Ambulatory Visit: Payer: Medicare Other

## 2020-08-22 ENCOUNTER — Other Ambulatory Visit: Payer: Self-pay

## 2020-08-22 ENCOUNTER — Ambulatory Visit
Admission: RE | Admit: 2020-08-22 | Discharge: 2020-08-22 | Disposition: A | Discharge status: Home / Self Care | Payer: Medicare Other | Source: Ambulatory Visit | Attending: Registered Nurse | Admitting: Registered Nurse

## 2020-08-22 DIAGNOSIS — Z1231 Encounter for screening mammogram for malignant neoplasm of breast: Secondary | ICD-10-CM

## 2020-08-27 NOTE — Progress Notes (Signed)
Patient is a 75 year old female here for follow-up after removal of cranial hardware and closure with adjacent tissue transfer by Dr. Arita Miss.  She is 2 months postop.  She reports overall she is doing well.  She has not having any infectious symptoms.  She would like to have the sutures removed.  On exam she has some scabbing at the edges.  The most inferior posterior incision has a small wound that is approximately 3 x 3 mm.  There is no surrounding erythema or cellulitic changes.  There is no subcutaneous fluid collection or purulence noted with palpation.  She does have some raw areas along the incision line that are still present but these appear to be doing well.  Recommend small amount of Vaseline to the area daily.  Recommend continuing showering to keep the area clean.  Recommend following up in 6 weeks for reevaluation.  I discussed with her to call if she has any questions or concerns or if her symptoms change prior to her follow-up appointment.  Recommend avoiding any hair dye in this area.

## 2020-08-28 ENCOUNTER — Ambulatory Visit (INDEPENDENT_AMBULATORY_CARE_PROVIDER_SITE_OTHER): Payer: Medicare Other | Admitting: Surgical

## 2020-08-28 ENCOUNTER — Other Ambulatory Visit: Payer: Self-pay

## 2020-08-28 ENCOUNTER — Encounter: Payer: Self-pay | Admitting: Surgical

## 2020-08-28 VITALS — BP 121/71 | HR 79

## 2020-08-28 DIAGNOSIS — T84498A Other mechanical complication of other internal orthopedic devices, implants and grafts, initial encounter: Secondary | ICD-10-CM

## 2020-09-20 ENCOUNTER — Other Ambulatory Visit: Payer: Medicare Other

## 2020-09-22 ENCOUNTER — Other Ambulatory Visit: Payer: Self-pay

## 2020-09-22 ENCOUNTER — Ambulatory Visit
Admission: RE | Admit: 2020-09-22 | Discharge: 2020-09-22 | Disposition: A | Discharge status: Home / Self Care | Payer: Medicare Other | Source: Ambulatory Visit | Attending: Registered Nurse | Admitting: Registered Nurse

## 2020-09-22 DIAGNOSIS — E2839 Other primary ovarian failure: Secondary | ICD-10-CM

## 2020-09-25 ENCOUNTER — Other Ambulatory Visit: Payer: Medicare Other

## 2020-10-09 ENCOUNTER — Ambulatory Visit (INDEPENDENT_AMBULATORY_CARE_PROVIDER_SITE_OTHER): Payer: Medicare Other | Admitting: Surgical

## 2020-10-09 ENCOUNTER — Other Ambulatory Visit: Payer: Self-pay

## 2020-10-09 ENCOUNTER — Encounter: Payer: Self-pay | Admitting: Surgical

## 2020-10-09 DIAGNOSIS — T84498A Other mechanical complication of other internal orthopedic devices, implants and grafts, initial encounter: Secondary | ICD-10-CM

## 2020-10-09 NOTE — Progress Notes (Signed)
   Referring Provider Loura Back, NP [address] [place],  [place] [address]   CC: No chief complaint on file.     Ellen Griffith is an 75 y.o. female.  HPI: Patient is a 75 year old female here for follow-up after removal of cranial hardware by Dr. Johnsie Cancel followed by closure with adjacent tissue transfer by Dr. Arita Miss on 06/26/2020.    Patient reports she is overall doing well.  She feels as if the area has healed.  She reports that she is still having some numbness on her scalp near the posterior right side.  She has some questions about this.  Review of Systems General: No fevers or chills Skin: +numbness  Physical Exam Vitals with BMI 08/28/2020 08/02/2020 07/19/2020  Height - - -  Weight - - -  BMI - - -  Systolic 121 102 638  Diastolic 71 65 77  Pulse 79 90 55    General:  No acute distress,  Alert and oriented, Non-Toxic, Normal speech and affect Scalp: Right posterior auricular scalp wound has completely healed.  No wounds noted.  No foul odor is noted.  No drainage noted.  No surrounding erythema or cellulitic changes noted.  No tenderness to palpation.  Assessment/Plan Patient is a 75 year old female status post removal of cranial hardware by Dr. Johnsie Cancel followed by closure with adjacent tissue transfer by Dr. Arita Miss on 06/26/2020.  Postoperatively she did have a little bit of breakdown which has subsequently healed and she is doing well.    Recommend following up on an as-needed basis.  Recommend calling with questions or concerns.  Picture was taken and placed in patient's chart with patient permission.  Kermit Balo Ellen Griffith 10/09/2020, 9:34 AM

## 2021-07-12 IMAGING — MG MM DIGITAL SCREENING BILAT W/ TOMO AND CAD
8 series · 8 of 24 positions shown · non-contrast
Comparison: None.

CLINICAL DATA: Screening.

EXAM:
DIGITAL SCREENING BILATERAL MAMMOGRAM WITH TOMOSYNTHESIS AND CAD
TECHNIQUE: Bilateral screening digital craniocaudal and mediolateral oblique
mammograms were obtained. Bilateral screening digital breast
tomosynthesis was performed. The images were evaluated with
computer-aided detection.

[L MLO synth-2D]
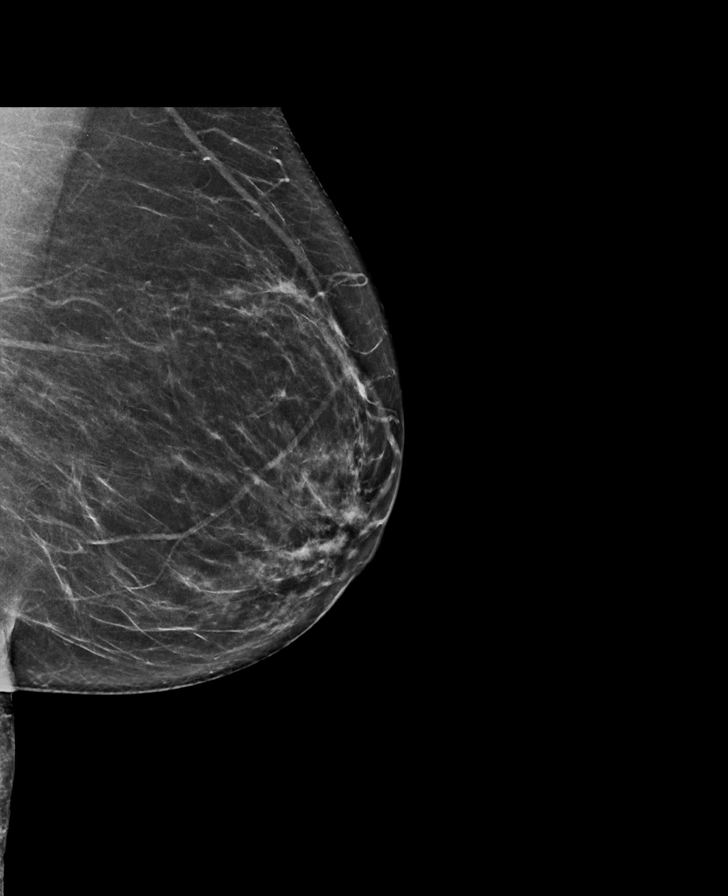

[L CC synth-2D]
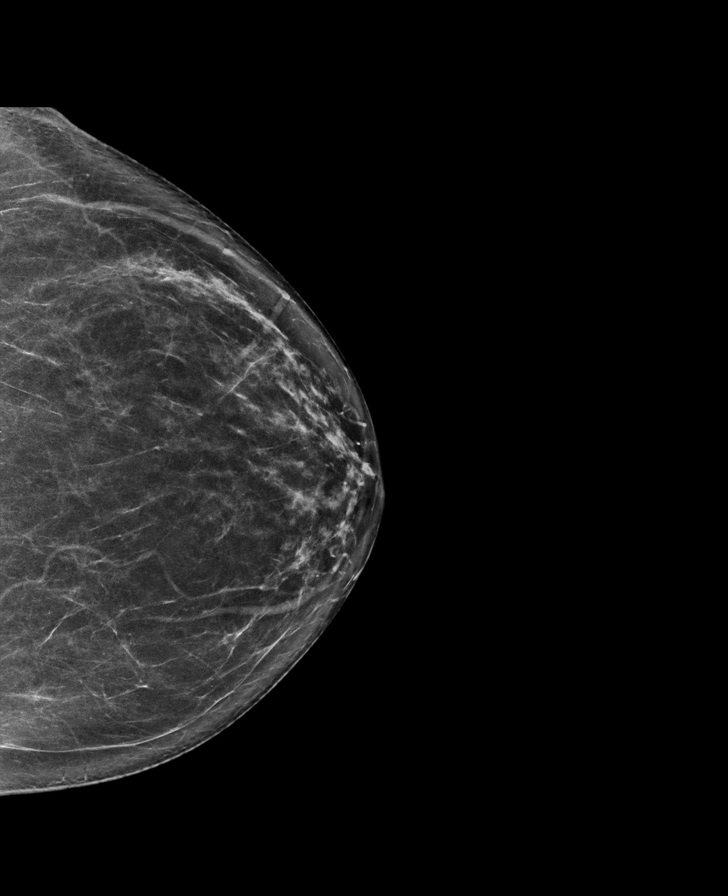

[R MLO synth-2D]
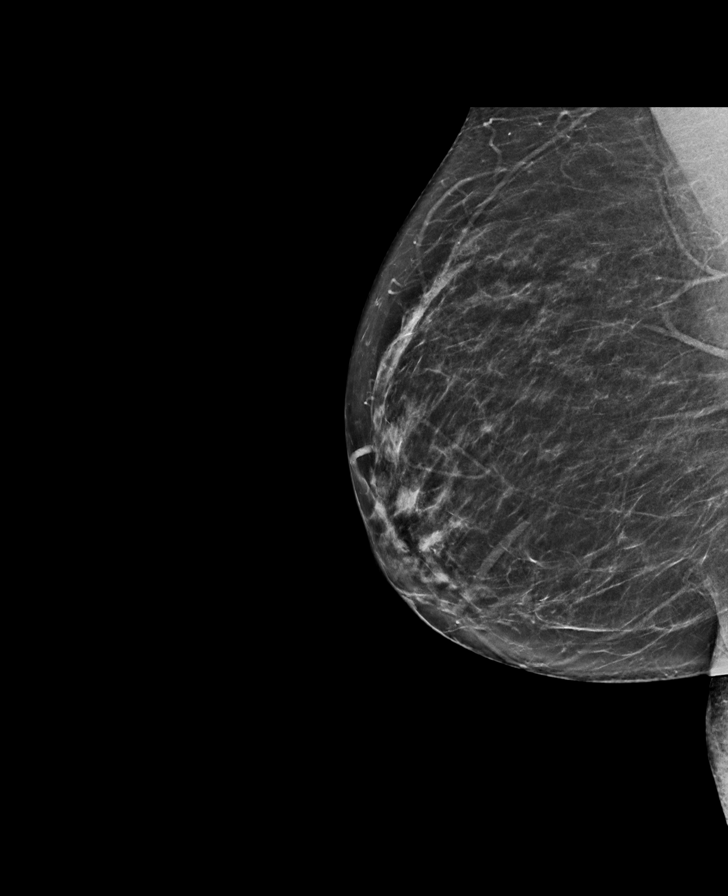

[R CC synth-2D]
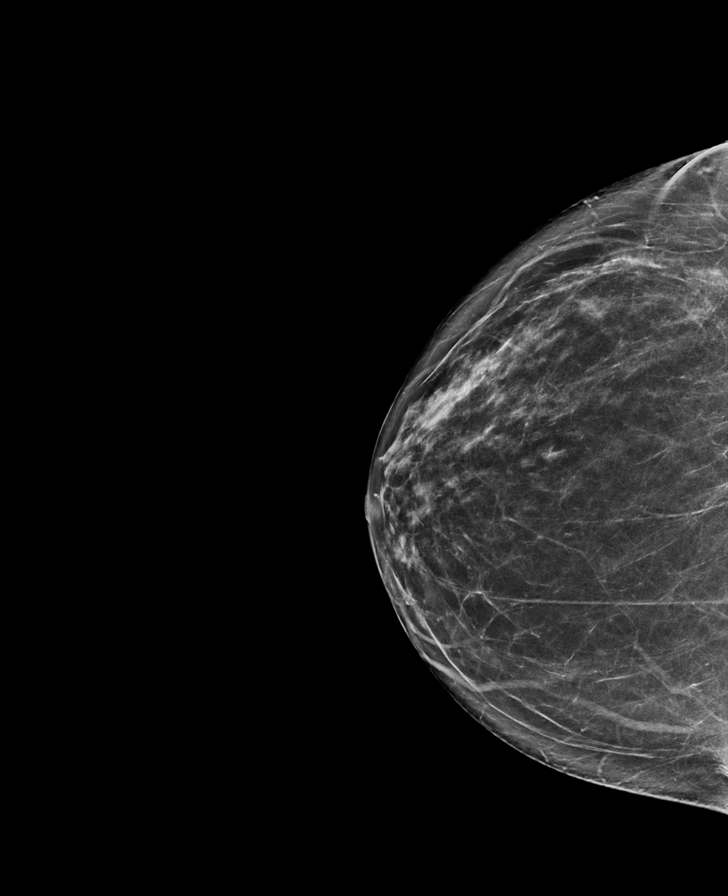

[R CC tomo · tomo slice 35/70.0]
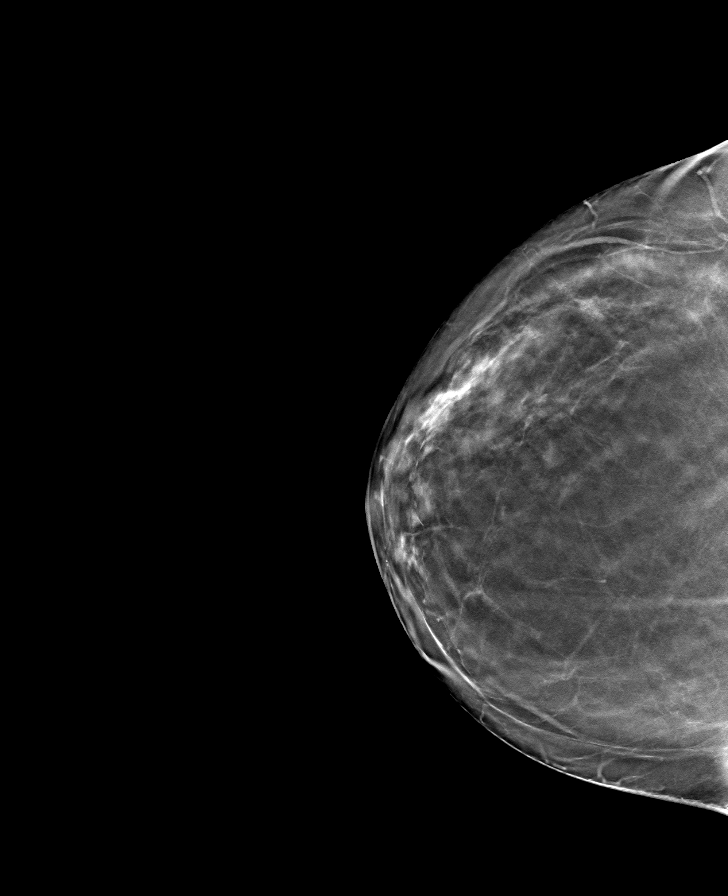

[L MLO tomo · tomo slice 36/71.0]
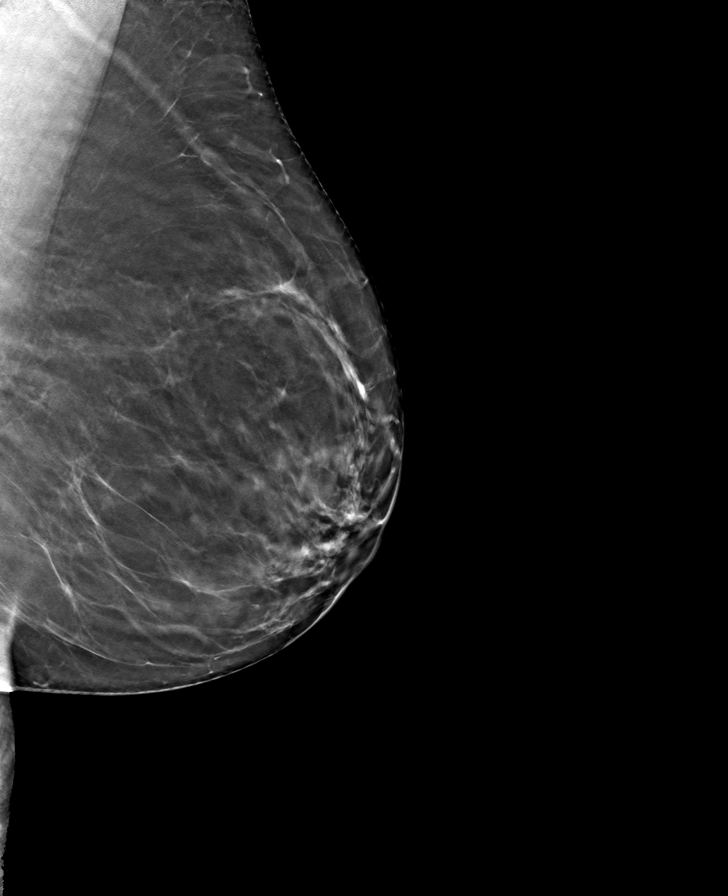

[R MLO tomo · tomo slice 35/68.0]
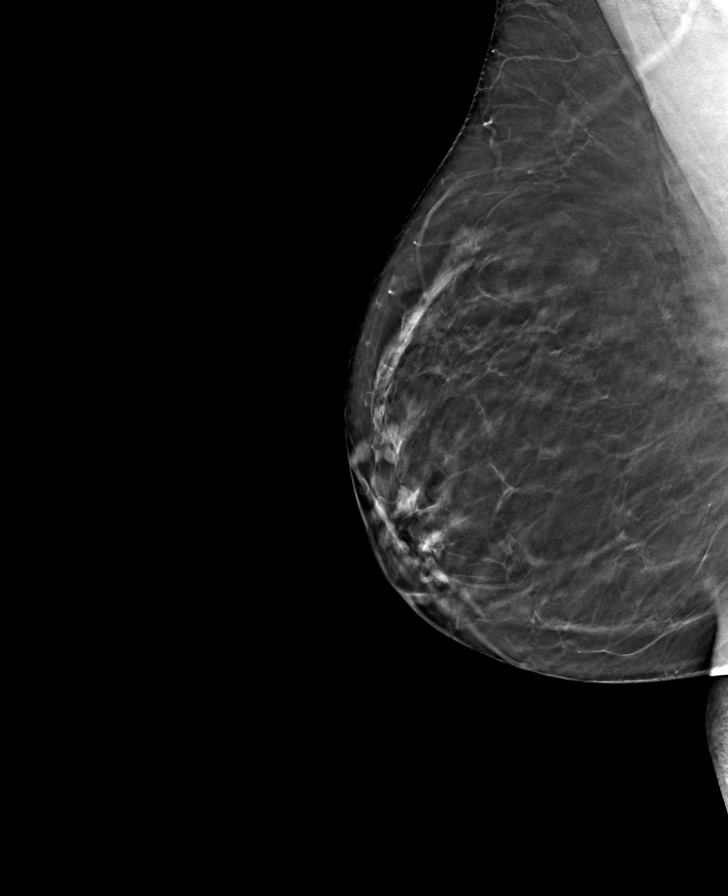

[L CC tomo · tomo slice 37/73.0]
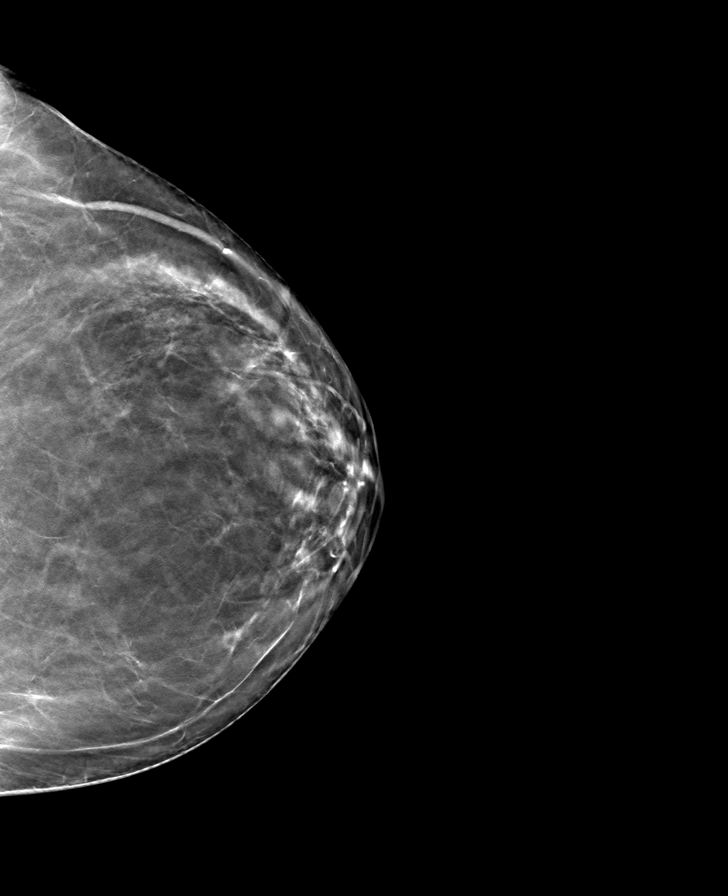

[8 of 24 positions shown; findings below may reference images not displayed]

ACR Breast Density Category b: There are scattered areas of
fibroglandular density.
FINDINGS: There are no findings suspicious for malignancy. The images were
evaluated with computer-aided detection.
IMPRESSION: No mammographic evidence of malignancy. A result letter of this
screening mammogram will be mailed directly to the patient.

RECOMMENDATION:
Screening mammogram in one year. (Code:C7-6-ASJ)

BI-RADS CATEGORY  1: Negative.

## 2023-08-05 ENCOUNTER — Telehealth: Admitting: Physician Assistant

## 2023-08-05 DIAGNOSIS — R058 Other specified cough: Secondary | ICD-10-CM

## 2023-08-05 DIAGNOSIS — J22 Unspecified acute lower respiratory infection: Secondary | ICD-10-CM | POA: Diagnosis not present

## 2023-08-05 MED ORDER — BENZONATATE 200 MG PO CAPS: 200.0000 mg | ORAL_CAPSULE | Freq: Three times a day (TID) | ORAL | 0 refills | Status: AC | PRN

## 2023-08-05 MED ORDER — AZITHROMYCIN 250 MG PO TABS: ORAL_TABLET | ORAL | 0 refills | Status: AC

## 2023-08-05 MED ORDER — PROMETHAZINE-DM 6.25-15 MG/5ML PO SYRP: 5.0000 mL | ORAL_SOLUTION | Freq: Every evening | ORAL | 0 refills | Status: AC | PRN

## 2023-08-05 NOTE — Progress Notes (Signed)
 Virtual Visit Consent   Geniya Witcher, you are scheduled for a virtual visit with a [facility] provider today. Just as with appointments in the office, your consent must be obtained to participate. Your consent will be active for this visit and any virtual visit you may have with one of our providers in the next 365 days. If you have a MyChart account, a copy of this consent can be sent to you electronically.  As this is a virtual visit, video technology does not allow for your provider to perform a traditional examination. This may limit your provider's ability to fully assess your condition. If your provider identifies any concerns that need to be evaluated in person or the need to arrange testing (such as labs, EKG, etc.), we will make arrangements to do so. Although advances in technology are sophisticated, we cannot ensure that it will always work on either your end or our end. If the connection with a video visit is poor, the visit may have to be switched to a telephone visit. With either a video or telephone visit, we are not always able to ensure that we have a secure connection.  By engaging in this virtual visit, you consent to the provision of healthcare and authorize for your insurance to be billed (if applicable) for the services provided during this visit. Depending on your insurance coverage, you may receive a charge related to this service.  I need to obtain your verbal consent now. Are you willing to proceed with your visit today? Ellen Griffith has provided verbal consent on 08/05/2023 for a virtual visit (video or telephone). Gilberto Better, [place]  Date: 08/05/2023 5:52 PM   Virtual Visit via Video Note   I, Ellen Griffith, connected with  Ellen Griffith  ([id number], [birth year]) on 08/05/23 at  5:30 PM EDT by a video-enabled telemedicine application and verified that I am speaking with the correct person using two identifiers.  Location: Patient: Virtual Visit Location Patient:  Home Provider: Virtual Visit Location Provider: Home Office   I discussed the limitations of evaluation and management by telemedicine and the availability of in person appointments. The patient expressed understanding and agreed to proceed.    History of Present Illness: Ellen Griffith is a 78 y.o. who identifies as a female who was assigned female at birth, and is being seen today for cough.  HPI: 78 y/o F presents via telehealth video visit for c/o cough x 2 weeks. + sinus pressure and nasal congestion. Denies fever. No body aches. Denies sore throat. No wheezing. No h/o asthma. Cough worse at night and stays awake due to coughing fits.   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) 06/28/2020   Trigeminal neuralgia of right side of face 06/12/2020    Allergies: No Known Allergies Medications:  Current Outpatient Medications:    azithromycin (ZITHROMAX) 250 MG tablet, Take 2 tablets on day 1, then 1 tablet daily on days 2 through 5, Disp: 6 tablet, Rfl: 0   benzonatate (TESSALON) 200 MG capsule, Take 1 capsule (200 mg total) by mouth 3 (three) times daily as needed for cough., Disp: 30 capsule, Rfl: 0   promethazine-dextromethorphan (PROMETHAZINE-DM) 6.25-15 MG/5ML syrup, Take 5 mLs by mouth at bedtime as needed for cough., Disp: 118 mL, Rfl: 0   LIVALO 4 MG TABS, Take 4 mg by mouth daily., Disp: , Rfl:    topiramate (TOPAMAX) 200 MG tablet, Take 200  mg by mouth 2 (two) times daily., Disp: , Rfl:    VASCEPA 1 g capsule, Take 2 g by mouth 2 (two) times daily., Disp: , Rfl:    venlafaxine XR (EFFEXOR-XR) 75 MG 24 hr capsule, Take 75 mg by mouth daily., Disp: , Rfl:    Vitamin D, Ergocalciferol, (DRISDOL) 1.25 MG (50000 UNIT) CAPS capsule, Take 50,000 Units by mouth once a week., Disp: , Rfl:   Observations/Objective: Patient is well-developed, well-nourished in no acute distress.  Resting comfortably  at home.  Head is  normocephalic, atraumatic.  No labored breathing.  Speech is clear and coherent with logical content.  Patient is alert and oriented at baseline.    Assessment and Plan: 1. Lower respiratory infection (Primary) - azithromycin (ZITHROMAX) 250 MG tablet; Take 2 tablets on day 1, then 1 tablet daily on days 2 through 5  Dispense: 6 tablet; Refill: 0  2. Cough productive of clear sputum - benzonatate (TESSALON) 200 MG capsule; Take 1 capsule (200 mg total) by mouth 3 (three) times daily as needed for cough.  Dispense: 30 capsule; Refill: 0 - promethazine-dextromethorphan (PROMETHAZINE-DM) 6.25-15 MG/5ML syrup; Take 5 mLs by mouth at bedtime as needed for cough.  Dispense: 118 mL; Refill: 0  Stay well hydrated.  Start medicines as prescribed. Continue to watch for worsening symptoms.  If symptoms don't improve in 3-4 days then consider a f/u appointment at Urgent Care clinic for further evaluation. Pt verbalized understanding and in agreement.    Follow Up Instructions: I discussed the assessment and treatment plan with the patient. The patient was provided an opportunity to ask questions and all were answered. The patient agreed with the plan and demonstrated an understanding of the instructions.  A copy of instructions were sent to the patient via MyChart unless otherwise noted below.   Patient has requested to receive PHI (AVS, Work Notes, etc) pertaining to this video visit through e-mail as they are currently without active MyChart. They have voiced understand that email is not considered secure and their health information could be viewed by someone other than the patient.   The patient was advised to call back or seek an in-person evaluation if the symptoms worsen or if the condition fails to improve as anticipated.    Gilberto Better, PA-C

## 2023-08-05 NOTE — Progress Notes (Signed)
 The patient no-showed for appointment despite this provider sending direct link with no response and waiting for at least 10 minutes from appointment time for patient to join. They will be marked as a NS for this appointment/time.   Piedad Climes, PA-C

## 2023-08-05 NOTE — Patient Instructions (Signed)
 Ellen Griffith, thank you for joining Gilberto Better, PA-C for today's virtual visit.  While this provider is not your primary care provider (PCP), if your PCP is located in our provider database this encounter information will be shared with them immediately following your visit.   A [facility] MyChart account gives you access to today's visit and all your visits, tests, and labs performed at [facility] " click here if you don't have a Ellen Griffith MyChart account or go to [email or web address]  Consent: (Patient) Ellen Griffith provided verbal consent for this virtual visit at the beginning of the encounter.  Current Medications:  Current Outpatient Medications:    azithromycin (ZITHROMAX) 250 MG tablet, Take 2 tablets on day 1, then 1 tablet daily on days 2 through 5, Disp: 6 tablet, Rfl: 0   benzonatate (TESSALON) 200 MG capsule, Take 1 capsule (200 mg total) by mouth 3 (three) times daily as needed for cough., Disp: 30 capsule, Rfl: 0   promethazine-dextromethorphan (PROMETHAZINE-DM) 6.25-15 MG/5ML syrup, Take 5 mLs by mouth at bedtime as needed for cough., Disp: 118 mL, Rfl: 0   LIVALO 4 MG TABS, Take 4 mg by mouth daily., Disp: , Rfl:    topiramate (TOPAMAX) 200 MG tablet, Take 200 mg by mouth 2 (two) times daily., Disp: , Rfl:    VASCEPA 1 g capsule, Take 2 g by mouth 2 (two) times daily., Disp: , Rfl:    venlafaxine XR (EFFEXOR-XR) 75 MG 24 hr capsule, Take 75 mg by mouth daily., Disp: , Rfl:    Vitamin D, Ergocalciferol, (DRISDOL) 1.25 MG (50000 UNIT) CAPS capsule, Take 50,000 Units by mouth once a week., Disp: , Rfl:    Medications ordered in this encounter:  Meds ordered this encounter  Medications   azithromycin (ZITHROMAX) 250 MG tablet    Sig: Take 2 tablets on day 1, then 1 tablet daily on days 2 through 5    Dispense:  6 tablet    Refill:  0    Supervising Provider:   Merrilee Jansky [[id number]]   benzonatate (TESSALON) 200 MG capsule    Sig: Take 1 capsule  (200 mg total) by mouth 3 (three) times daily as needed for cough.    Dispense:  30 capsule    Refill:  0    Supervising Provider:   Merrilee Jansky [[id number]]   promethazine-dextromethorphan (PROMETHAZINE-DM) 6.25-15 MG/5ML syrup    Sig: Take 5 mLs by mouth at bedtime as needed for cough.    Dispense:  118 mL    Refill:  0    Supervising Provider:   Merrilee Jansky [[id number]]     *If you need refills on other medications prior to your next appointment, please contact your pharmacy*  Follow-Up: Call back or seek an in-person evaluation if the symptoms worsen or if the condition fails to improve as anticipated.  [facility] [phone]  Other Instructions Stay well hydrated.  Start medicines as prescribed. Continue to watch for worsening symptoms.  If symptoms don't improve in 3-4 days then consider a f/u appointment at Urgent Care clinic for further evaluation.   If you have been instructed to have an in-person evaluation today at a local Urgent Care facility, please use the link below. It will take you to a list of all of our available [facility], including address, phone number and hours of operation. Please do not delay care.   Urgent Cares  If you or a family  member do not have a primary care provider, use the link below to schedule a visit and establish care. When you choose a [facility] primary care physician or advanced practice provider, you gain a long-term partner in health. Find a Primary Care Provider  Learn more about [facility]'s in-office and virtual care options: [facility] - Get Care Now
